# Patient Record
Sex: Female | Born: 1950
Health system: Southern US, Community
[De-identification: ages and names within clinical notes are randomized; demographics above are authoritative.]

## PROBLEM LIST (undated history)

## (undated) DIAGNOSIS — E785 Hyperlipidemia, unspecified: Secondary | ICD-10-CM

## (undated) DIAGNOSIS — T7840XA Allergy, unspecified, initial encounter: Secondary | ICD-10-CM

## (undated) DIAGNOSIS — M8580 Other specified disorders of bone density and structure, unspecified site: Secondary | ICD-10-CM

## (undated) DIAGNOSIS — J309 Allergic rhinitis, unspecified: Secondary | ICD-10-CM

## (undated) HISTORY — DX: Allergy, unspecified, initial encounter: T78.40XA

## (undated) HISTORY — PX: ABDOMINAL HYSTERECTOMY: SHX81

## (undated) HISTORY — PX: BREAST SURGERY: SHX581

---

## 1998-05-04 HISTORY — PX: BREAST BIOPSY: SHX20

## 2005-11-19 ENCOUNTER — Ambulatory Visit: Payer: Self-pay

## 2006-12-02 ENCOUNTER — Ambulatory Visit: Payer: Self-pay

## 2007-12-08 ENCOUNTER — Ambulatory Visit: Payer: Self-pay | Admitting: Family Medicine

## 2008-11-16 ENCOUNTER — Ambulatory Visit: Payer: Self-pay | Admitting: Gastroenterology

## 2008-12-11 ENCOUNTER — Ambulatory Visit: Payer: Self-pay | Admitting: Family Medicine

## 2009-12-11 ENCOUNTER — Ambulatory Visit: Payer: Self-pay | Admitting: Family Medicine

## 2010-12-24 ENCOUNTER — Ambulatory Visit: Payer: Self-pay | Admitting: Family Medicine

## 2011-12-29 ENCOUNTER — Ambulatory Visit: Payer: Self-pay | Admitting: Family Medicine

## 2012-12-29 ENCOUNTER — Ambulatory Visit: Payer: Self-pay | Admitting: Family Medicine

## 2014-03-26 ENCOUNTER — Ambulatory Visit: Payer: Self-pay | Admitting: Family Medicine

## 2014-04-11 ENCOUNTER — Ambulatory Visit: Payer: Self-pay | Admitting: Family Medicine

## 2015-07-15 ENCOUNTER — Other Ambulatory Visit: Payer: Self-pay | Admitting: Family Medicine

## 2015-07-15 DIAGNOSIS — Z1231 Encounter for screening mammogram for malignant neoplasm of breast: Secondary | ICD-10-CM

## 2015-07-22 ENCOUNTER — Ambulatory Visit
Admission: RE | Admit: 2015-07-22 | Discharge: 2015-07-22 | Disposition: A | Discharge status: Home / Self Care | Payer: BLUE CROSS/BLUE SHIELD | Source: Ambulatory Visit | Attending: Family Medicine | Admitting: Family Medicine

## 2015-07-22 DIAGNOSIS — Z1231 Encounter for screening mammogram for malignant neoplasm of breast: Secondary | ICD-10-CM | POA: Insufficient documentation

## 2015-07-22 IMAGING — MG MM ADDITIONAL VIEWS AT NO CHARGE
6 series · 6 of 6 positions shown · non-contrast
Comparison: Prior exams

CLINICAL DATA: Screening callback for 3 questioned abnormalities, 2
in the right breast and 1 in the left breast

EXAM:
DIGITAL DIAGNOSTIC  bilateral MAMMOGRAM WITH CAD
ULTRASOUND right BREAST

[R CC (1 of 2)]
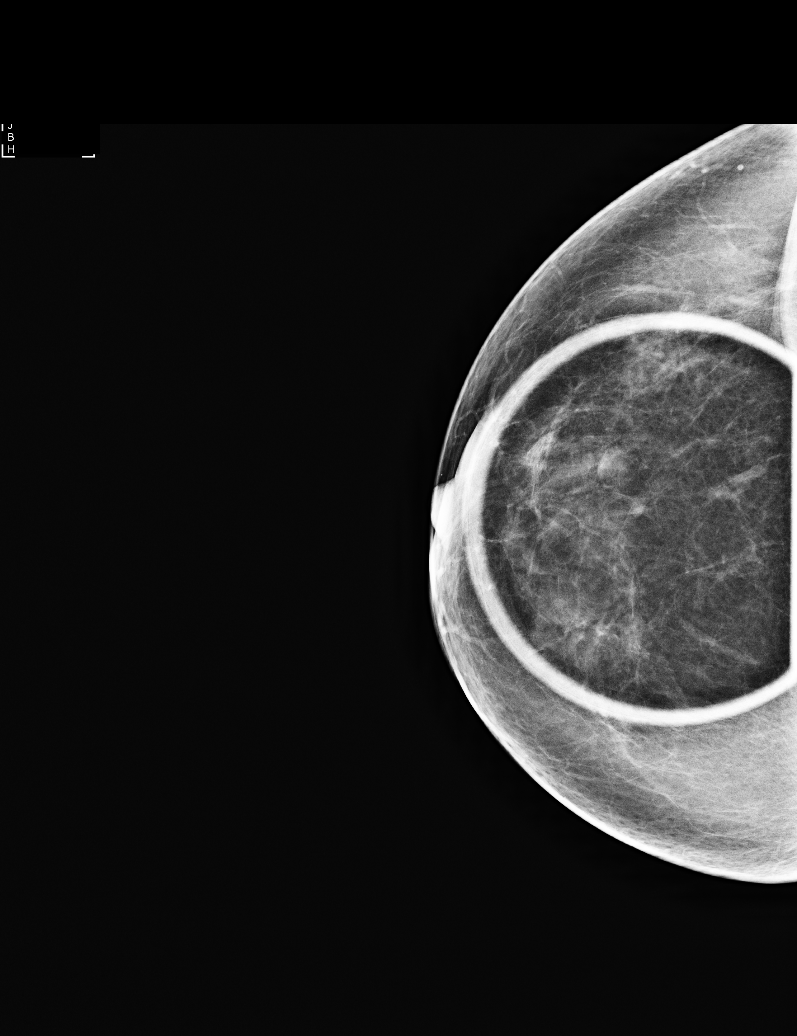

[R CC (2 of 2)]
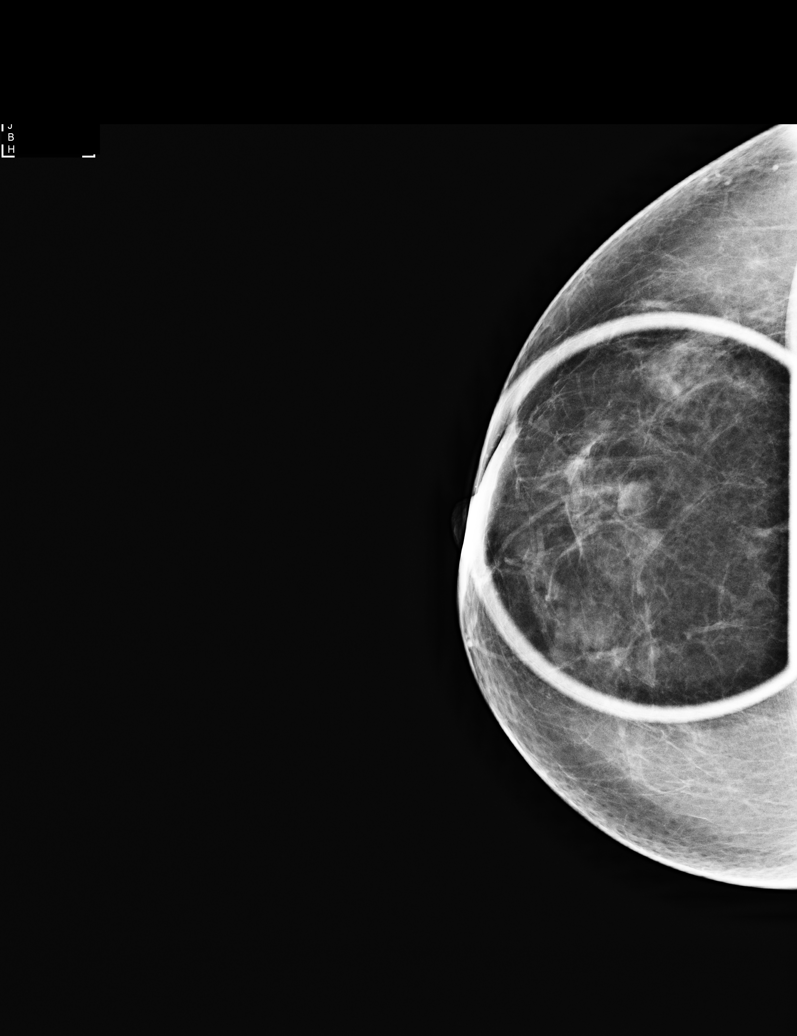

[R MLO (1 of 2)]
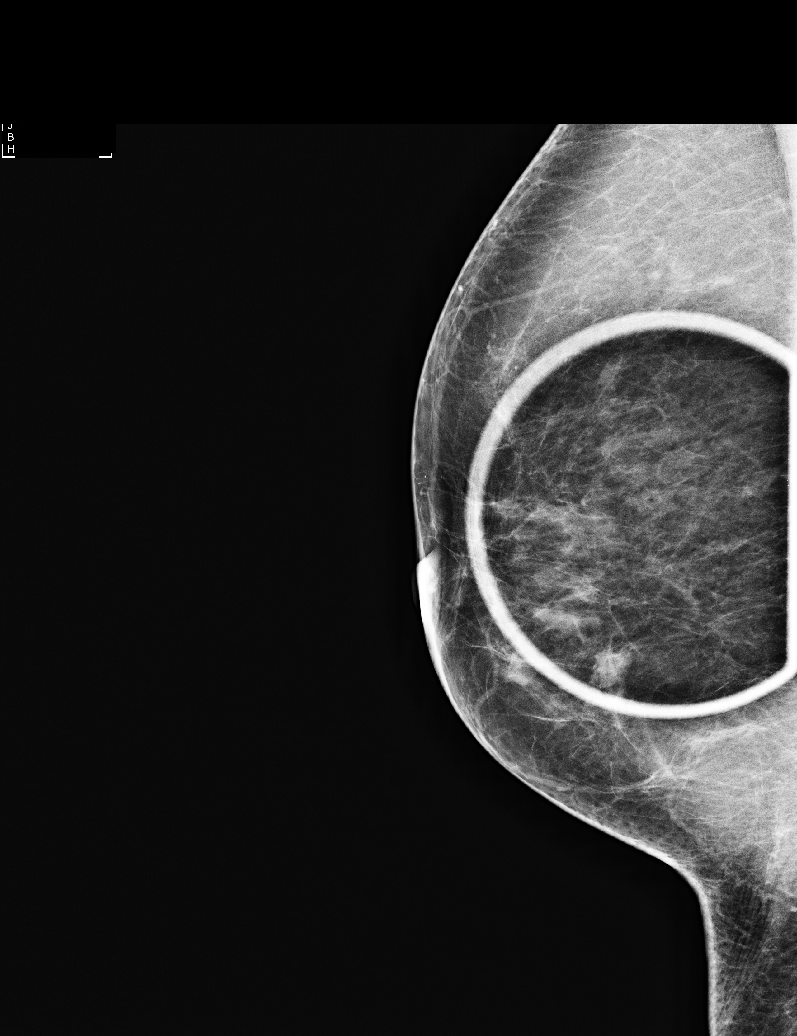

[L MLO]
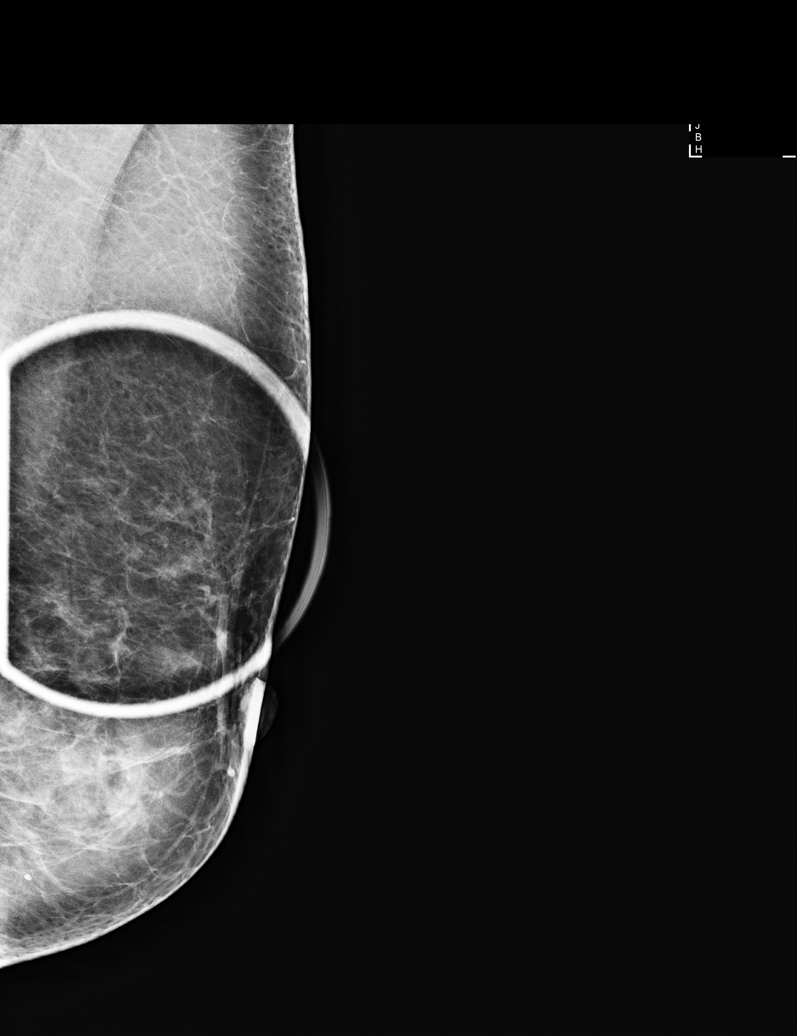

[R MLO (2 of 2)]
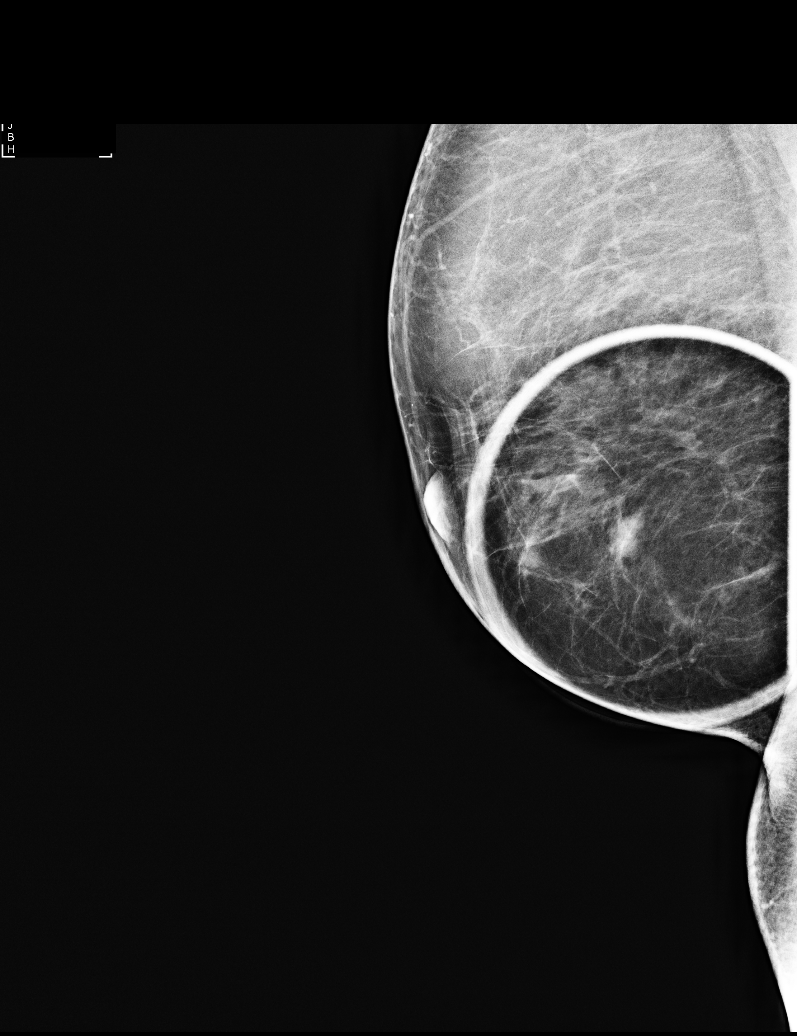

[L ML]
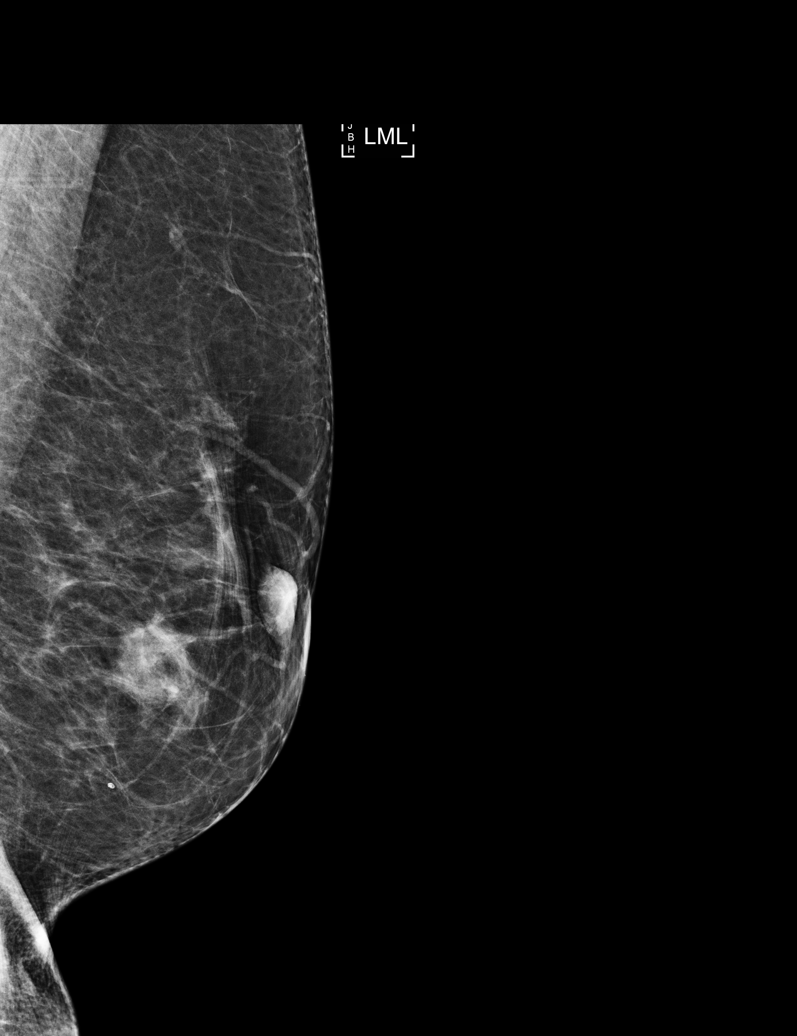

[6 of 6 positions shown; findings below may reference images not displayed]

ACR Breast Density Category b: There are scattered areas of
fibroglandular density.
FINDINGS: No persistent abnormality is identified in the left upper breast or
right central posterior breast. There is a possible persistent mass
in the right breast 6 o'clock location.

Mammographic images were processed with CAD.

On physical exam, I palpate no abnormality in the right breast 6
o'clock location.

Ultrasound is performed, showing a cluster of anechoic simple
appearing cyst right breast 6 o'clock location 3 cm from the nipple
measuring 6 x 6 x 5 mm. This corresponds to the mammographic finding
in this area.
IMPRESSION: No evidence for malignancy in either breast.

RECOMMENDATION:
Screening mammogram in one year.(Code:FG-H-2IR)

I have discussed the findings and recommendations with the patient.
Results were also provided in writing at the conclusion of the
visit. If applicable, a reminder letter will be sent to the patient
regarding the next appointment.

BI-RADS CATEGORY  2: Benign.

## 2015-07-22 IMAGING — US US BREAST*R* LIMITED INC AXILLA
1 series · 4 of 4 positions shown · non-contrast
Comparison: Prior exams

CLINICAL DATA: Screening callback for 3 questioned abnormalities, 2
in the right breast and 1 in the left breast

EXAM:
DIGITAL DIAGNOSTIC  bilateral MAMMOGRAM WITH CAD
ULTRASOUND right BREAST

[Series 1: us breast*right* limited inc axilla · 0.08mm/px · 4 of 4 slices shown]
[im 1/4]
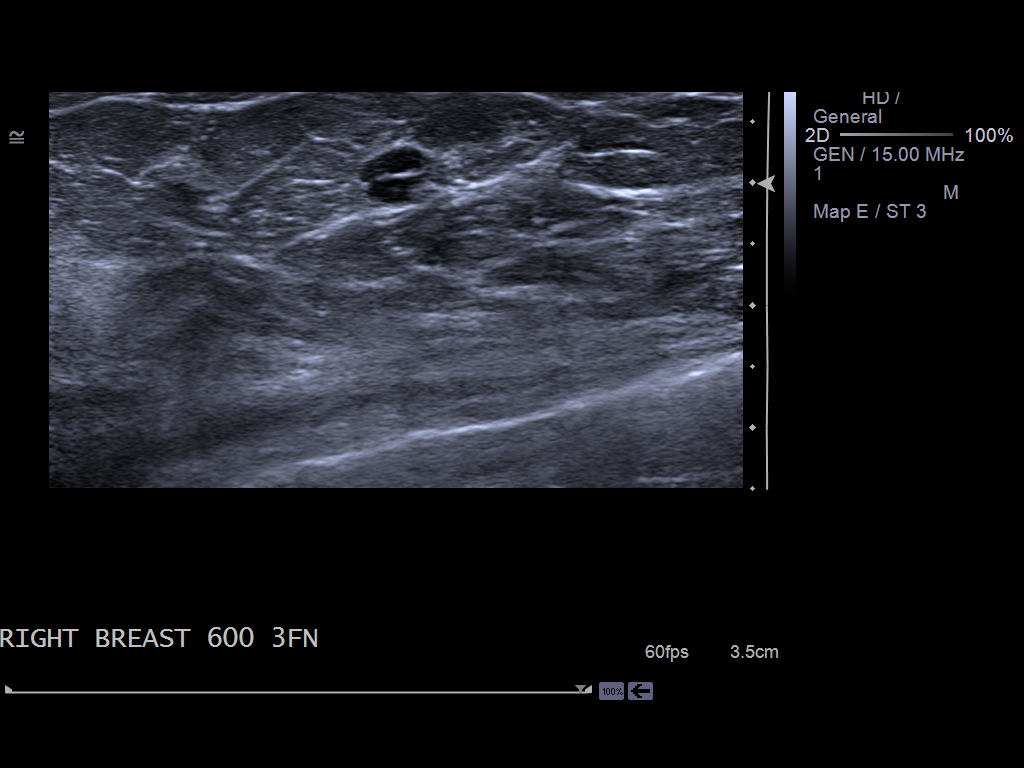
[im 2/4]
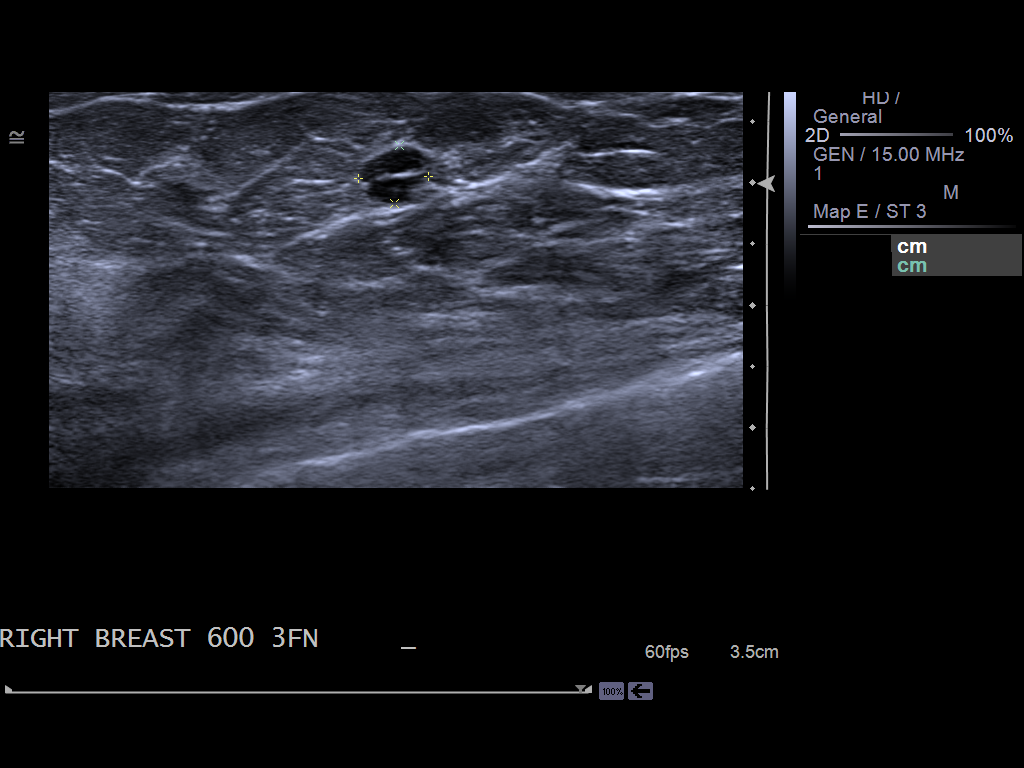
[im 3/4]
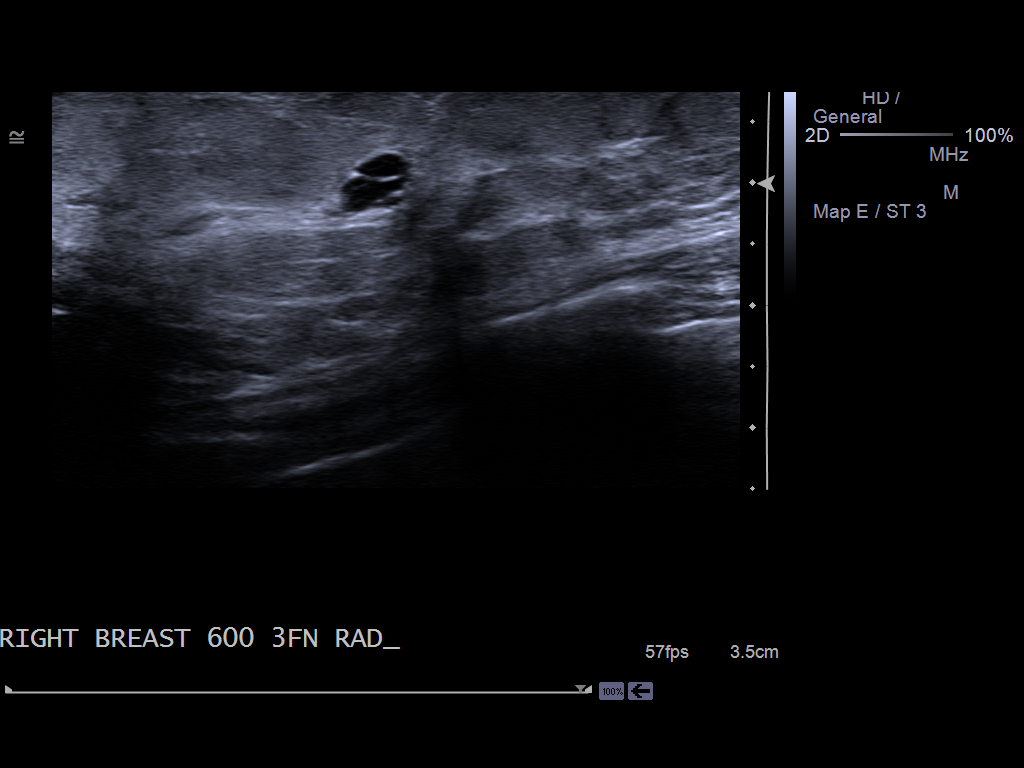
[im 4/4]
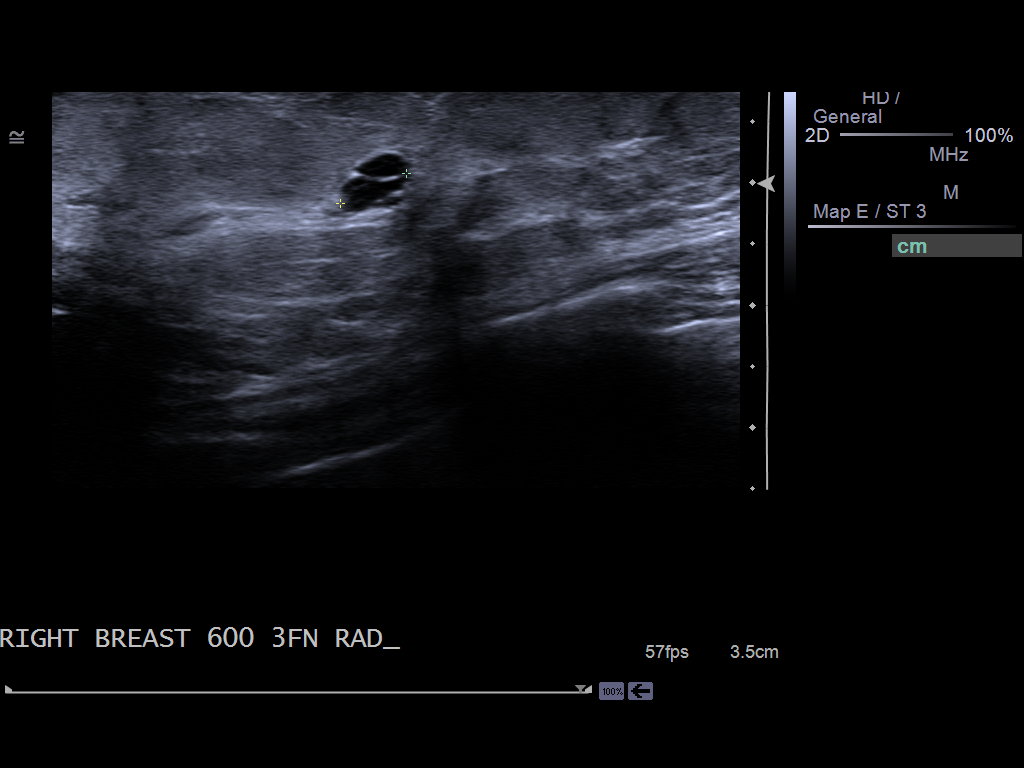

[4 of 4 positions shown; findings below may reference images not displayed]

ACR Breast Density Category b: There are scattered areas of
fibroglandular density.
FINDINGS: No persistent abnormality is identified in the left upper breast or
right central posterior breast. There is a possible persistent mass
in the right breast 6 o'clock location.

Mammographic images were processed with CAD.

On physical exam, I palpate no abnormality in the right breast 6
o'clock location.

Ultrasound is performed, showing a cluster of anechoic simple
appearing cyst right breast 6 o'clock location 3 cm from the nipple
measuring 6 x 6 x 5 mm. This corresponds to the mammographic finding
in this area.
IMPRESSION: No evidence for malignancy in either breast.

RECOMMENDATION:
Screening mammogram in one year.(Code:FG-H-2IR)

I have discussed the findings and recommendations with the patient.
Results were also provided in writing at the conclusion of the
visit. If applicable, a reminder letter will be sent to the patient
regarding the next appointment.

BI-RADS CATEGORY  2: Benign.

## 2016-06-15 ENCOUNTER — Other Ambulatory Visit: Payer: Self-pay | Admitting: Family Medicine

## 2016-06-15 DIAGNOSIS — Z1231 Encounter for screening mammogram for malignant neoplasm of breast: Secondary | ICD-10-CM

## 2016-07-22 ENCOUNTER — Ambulatory Visit
Admission: RE | Admit: 2016-07-22 | Discharge: 2016-07-22 | Disposition: A | Discharge status: Home / Self Care | Payer: BLUE CROSS/BLUE SHIELD | Source: Ambulatory Visit | Attending: Family Medicine | Admitting: Family Medicine

## 2016-07-22 DIAGNOSIS — Z1231 Encounter for screening mammogram for malignant neoplasm of breast: Secondary | ICD-10-CM | POA: Diagnosis not present

## 2017-03-05 DIAGNOSIS — M8580 Other specified disorders of bone density and structure, unspecified site: Secondary | ICD-10-CM | POA: Diagnosis not present

## 2017-03-05 DIAGNOSIS — Z0000 Encounter for general adult medical examination without abnormal findings: Secondary | ICD-10-CM | POA: Diagnosis not present

## 2017-03-29 DIAGNOSIS — J029 Acute pharyngitis, unspecified: Secondary | ICD-10-CM | POA: Diagnosis not present

## 2017-03-29 DIAGNOSIS — J209 Acute bronchitis, unspecified: Secondary | ICD-10-CM | POA: Diagnosis not present

## 2017-07-06 ENCOUNTER — Other Ambulatory Visit: Payer: Self-pay | Admitting: Family Medicine

## 2017-07-06 DIAGNOSIS — Z1231 Encounter for screening mammogram for malignant neoplasm of breast: Secondary | ICD-10-CM

## 2017-07-30 ENCOUNTER — Ambulatory Visit
Admission: RE | Admit: 2017-07-30 | Discharge: 2017-07-30 | Disposition: A | Discharge status: Home / Self Care | Payer: BLUE CROSS/BLUE SHIELD | Source: Ambulatory Visit | Attending: Family Medicine | Admitting: Family Medicine

## 2017-07-30 DIAGNOSIS — Z1231 Encounter for screening mammogram for malignant neoplasm of breast: Secondary | ICD-10-CM | POA: Insufficient documentation

## 2018-02-09 DIAGNOSIS — R195 Other fecal abnormalities: Secondary | ICD-10-CM | POA: Diagnosis not present

## 2018-02-09 DIAGNOSIS — Z23 Encounter for immunization: Secondary | ICD-10-CM | POA: Diagnosis not present

## 2018-02-09 DIAGNOSIS — R197 Diarrhea, unspecified: Secondary | ICD-10-CM | POA: Diagnosis not present

## 2018-02-22 DIAGNOSIS — R195 Other fecal abnormalities: Secondary | ICD-10-CM | POA: Diagnosis not present

## 2018-03-09 DIAGNOSIS — R195 Other fecal abnormalities: Secondary | ICD-10-CM | POA: Diagnosis not present

## 2018-03-09 DIAGNOSIS — K219 Gastro-esophageal reflux disease without esophagitis: Secondary | ICD-10-CM | POA: Diagnosis not present

## 2018-06-03 ENCOUNTER — Encounter: Payer: Self-pay | Admitting: *Deleted

## 2018-06-06 ENCOUNTER — Encounter: Admission: RE | Payer: Self-pay | Source: Home / Self Care

## 2018-06-06 ENCOUNTER — Ambulatory Visit
Admission: RE | Admit: 2018-06-06 | Payer: BLUE CROSS/BLUE SHIELD | Source: Home / Self Care | Admitting: Gastroenterology

## 2018-06-06 HISTORY — DX: Allergic rhinitis, unspecified: J30.9

## 2018-06-06 HISTORY — DX: Other specified disorders of bone density and structure, unspecified site: M85.80

## 2018-06-06 HISTORY — DX: Hyperlipidemia, unspecified: E78.5

## 2018-06-06 SURGERY — COLONOSCOPY WITH PROPOFOL
Anesthesia: General

## 2019-01-12 DIAGNOSIS — H40013 Open angle with borderline findings, low risk, bilateral: Secondary | ICD-10-CM | POA: Diagnosis not present

## 2019-01-12 DIAGNOSIS — H01111 Allergic dermatitis of right upper eyelid: Secondary | ICD-10-CM | POA: Diagnosis not present

## 2019-01-12 DIAGNOSIS — H40033 Anatomical narrow angle, bilateral: Secondary | ICD-10-CM | POA: Diagnosis not present

## 2019-01-20 ENCOUNTER — Ambulatory Visit (INDEPENDENT_AMBULATORY_CARE_PROVIDER_SITE_OTHER): Payer: BC Managed Care – PPO | Admitting: Physician Assistant

## 2019-01-20 ENCOUNTER — Encounter: Payer: Self-pay | Admitting: Physician Assistant

## 2019-01-20 ENCOUNTER — Other Ambulatory Visit: Payer: Self-pay

## 2019-01-20 VITALS — BP 126/80 | HR 57 | Temp 97.1°F | Resp 16 | Ht 64.0 in | Wt 120.4 lb

## 2019-01-20 DIAGNOSIS — Z1329 Encounter for screening for other suspected endocrine disorder: Secondary | ICD-10-CM

## 2019-01-20 DIAGNOSIS — Z0000 Encounter for general adult medical examination without abnormal findings: Secondary | ICD-10-CM | POA: Diagnosis not present

## 2019-01-20 DIAGNOSIS — M810 Age-related osteoporosis without current pathological fracture: Secondary | ICD-10-CM

## 2019-01-20 DIAGNOSIS — Z130 Encounter for screening for diseases of the blood and blood-forming organs and certain disorders involving the immune mechanism: Secondary | ICD-10-CM | POA: Diagnosis not present

## 2019-01-20 DIAGNOSIS — Z131 Encounter for screening for diabetes mellitus: Secondary | ICD-10-CM

## 2019-01-20 DIAGNOSIS — Z1211 Encounter for screening for malignant neoplasm of colon: Secondary | ICD-10-CM

## 2019-01-20 DIAGNOSIS — Z1159 Encounter for screening for other viral diseases: Secondary | ICD-10-CM

## 2019-01-20 DIAGNOSIS — Z114 Encounter for screening for human immunodeficiency virus [HIV]: Secondary | ICD-10-CM

## 2019-01-20 DIAGNOSIS — Z1239 Encounter for other screening for malignant neoplasm of breast: Secondary | ICD-10-CM | POA: Diagnosis not present

## 2019-01-20 DIAGNOSIS — Z13220 Encounter for screening for lipoid disorders: Secondary | ICD-10-CM | POA: Diagnosis not present

## 2019-01-20 NOTE — Progress Notes (Signed)
Patient: Krista Thompson, Female    DOB: 1950-09-06, 68 y.o.   MRN: [id number] Visit Date: 01/20/2019  Today's Provider: Trinna Post, PA-C   Chief Complaint  Patient presents with  . New Patient (Initial Visit)   Subjective:     Annual physical exam Krista Thompson is a 68 y.o. female who presents today to establish care.   Living in [place] with husband of 61 years, has one child each. Three grandchildren. Visits family once a year. Works as a Scientist, water quality at [employer].   Flu shot 01/02/2019.  Pneumonia shot:Due Tetanus shot: due Influenza: given 01/02/2019  Colonoscopy missed due to being in [place] and not being able to come home due to [place].  -----------------------------------------------------------------   Review of Systems  Constitutional: Negative.   HENT: Negative.   Eyes: Negative.   Respiratory: Negative.   Cardiovascular: Negative.   Endocrine: Negative.   Genitourinary: Negative.   Musculoskeletal: Negative.   Skin: Negative.   Allergic/Immunologic: Negative.   Neurological: Negative.   Hematological: Negative.   Psychiatric/Behavioral: Negative.     Social History      She  reports that she has never smoked. She has never used smokeless tobacco. She reports previous alcohol use. She reports previous drug use.       Social History   Socioeconomic History  . Marital status: Married    Spouse name: Not on file  . Number of children: 1  . Years of education: Not on file  . Highest education level: Not on file  Occupational History  . Not on file  Social Needs  . Financial resource strain: Not on file  . Food insecurity    Worry: Not on file    Inability: Not on file  . Transportation needs    Medical: Not on file    Non-medical: Not on file  Tobacco Use  . Smoking status: Never Smoker  . Smokeless tobacco: Never Used  Substance and Sexual Activity  . Alcohol use: Not Currently  . Drug use: Not Currently  . Sexual activity: Not on file    Lifestyle  . Physical activity    Days per week: Not on file    Minutes per session: Not on file  . Stress: Not on file  Relationships  . Social Herbalist on phone: Not on file    Gets together: Not on file    Attends religious service: Not on file    Active member of club or organization: Not on file    Attends meetings of clubs or organizations: Not on file    Relationship status: Not on file  Other Topics Concern  . Not on file  Social History Narrative  . Not on file    Past Medical History:  Diagnosis Date  . Allergic rhinitis   . Hyperlipidemia   . Osteopenia      There are no active problems to display for this patient.   Past Surgical History:  Procedure Laterality Date  . BREAST BIOPSY Bilateral 2000   neg  . CESAREAN SECTION      Family History        Family Status  Relation Name Status  . Mother  Deceased  . Father  Deceased  . Daughter  Alive        Her family history includes Breast cancer (age of onset: 40) in her mother; Heart attack in her father.      No  Known Allergies   Current Outpatient Medications:  .  calcium-vitamin D (OSCAL WITH D) 500-200 MG-UNIT tablet, Take 1 tablet by mouth., Disp: , Rfl:  .  cyanocobalamin 1000 MCG tablet, Take 1,000 mcg by mouth daily., Disp: , Rfl:  .  Flaxseed Oil OIL, by Does not apply route., Disp: , Rfl:  .  Multiple Vitamin (MULTIVITAMIN) tablet, Take 1 tablet by mouth daily., Disp: , Rfl:  .  Omega 3 340 MG CPDR, Take 1 each by mouth 2 (two) times daily., Disp: , Rfl:  .  vitamin C (ASCORBIC ACID) 500 MG tablet, Take 500 mg by mouth daily., Disp: , Rfl:    Patient Care Team: Paulene Floor as PCP - General (Physician Assistant)    Objective:    Vitals: BP 126/80 (BP Location: Left Arm, Patient Position: Sitting, Cuff Size: Normal)   Pulse (!) 57   Temp (!) 97.1 F (36.2 C) (Temporal)   Resp 16   Ht 5\' 4"  (1.626 m)   Wt 120 lb 6.4 oz (54.6 kg)   SpO2 100%   BMI 20.67  kg/m    Vitals:   01/20/19 1515  BP: 126/80  Pulse: (!) 57  Resp: 16  Temp: (!) 97.1 F (36.2 C)  TempSrc: Temporal  SpO2: 100%  Weight: 120 lb 6.4 oz (54.6 kg)  Height: 5\' 4"  (1.626 m)     Physical Exam Constitutional:      Appearance: Normal appearance.  Cardiovascular:     Rate and Rhythm: Normal rate and regular rhythm.     Heart sounds: Normal heart sounds.  Pulmonary:     Effort: Pulmonary effort is normal.     Breath sounds: Normal breath sounds.  Abdominal:     General: Bowel sounds are normal.     Palpations: Abdomen is soft.  Skin:    General: Skin is warm and dry.  Neurological:     Mental Status: She is alert and oriented to person, place, and time. Mental status is at baseline.  Psychiatric:        Mood and Affect: Mood normal.        Behavior: Behavior normal.      Depression Screen PHQ 2/9 Scores 01/20/2019  PHQ - 2 Score 0  PHQ- 9 Score 0       Assessment & Plan:     Routine Health Maintenance and Physical Exam  Exercise Activities and Dietary recommendations Goals   None      There is no immunization history on file for this patient.  Health Maintenance  Topic Date Due  . Hepatitis C Screening  November 07, 1950  . TETANUS/TDAP  07/22/1971  . COLONOSCOPY  07/22/2002  . MAMMOGRAM  07/21/2017  . DEXA SCAN  07/21/2017  . PNA vac Low Risk Adult (1 of 2 - PCV13) 07/21/2017  . INFLUENZA VACCINE  12/03/2018     Discussed health benefits of physical activity, and encouraged her to engage in regular exercise appropriate for her age and condition.    1. Annual physical exam  Due to influenza vaccine < 1 month ago, will have to defer pneumonia and tetanus.   2. Colon cancer screening  - Ambulatory referral to Gastroenterology  3. Breast cancer screening  - MM Digital Screening; Future  4. Osteoporosis, unspecified osteoporosis type, unspecified pathological fracture presence  - DG Bone Density; Future  5. Encounter for hepatitis  C screening test for low risk patient  - Hepatitis C antibody  6. Encounter for screening  for HIV  - HIV antibody (with reflex)  7. Diabetes mellitus screening  - Comprehensive Metabolic Panel (CMET)  8. Screening cholesterol level  - Lipid Profile  9. Screening for deficiency anemia  - CBC with Differential  10. Thyroid disorder screening  - TSH  The entirety of the information documented in the History of Present Illness, Review of Systems and Physical Exam were personally obtained by me. Portions of this information were initially documented by Lynford Humphrey, CMA and reviewed by me for thoroughness and accuracy.   --------------------------------------------------------------------    Trinna Post, PA-C  [facility]

## 2019-01-20 NOTE — Patient Instructions (Addendum)
Call insurance about shingles vaccine  We can schedule

## 2019-01-21 LAB — CBC WITH DIFFERENTIAL/PLATELET
Basophils Absolute: 0 10*3/uL (ref 0.0–0.2)
Basos: 0 %
EOS (ABSOLUTE): 0.1 10*3/uL (ref 0.0–0.4)
Eos: 1 %
Hematocrit: 42.7 % (ref 34.0–46.6)
Hemoglobin: 14.2 g/dL (ref 11.1–15.9)
Immature Grans (Abs): 0 10*3/uL (ref 0.0–0.1)
Immature Granulocytes: 0 %
Lymphocytes Absolute: 1.9 10*3/uL (ref 0.7–3.1)
Lymphs: 39 %
MCH: 29.4 pg (ref 26.6–33.0)
MCHC: 33.3 g/dL (ref 31.5–35.7)
MCV: 88 fL (ref 79–97)
Monocytes Absolute: 0.4 10*3/uL (ref 0.1–0.9)
Monocytes: 7 %
Neutrophils Absolute: 2.6 10*3/uL (ref 1.4–7.0)
Neutrophils: 53 %
Platelets: 175 10*3/uL (ref 150–450)
RBC: 4.83 x10E6/uL (ref 3.77–5.28)
RDW: 13 % (ref 11.7–15.4)
WBC: 4.9 10*3/uL (ref 3.4–10.8)

## 2019-01-21 LAB — LIPID PANEL
Chol/HDL Ratio: 2.8 ratio (ref 0.0–4.4)
Cholesterol, Total: 231 mg/dL — ABNORMAL HIGH (ref 100–199)
HDL: 83 mg/dL (ref >39)
LDL Chol Calc (NIH): 138 mg/dL — ABNORMAL HIGH (ref 0–99)
Triglycerides: 60 mg/dL (ref 0–149)
VLDL Cholesterol Cal: 10 mg/dL (ref 5–40)

## 2019-01-21 LAB — HEPATITIS C ANTIBODY: Hep C Virus Ab: <0.1 s/co ratio (ref 0.0–0.9)

## 2019-01-21 LAB — COMPREHENSIVE METABOLIC PANEL
ALT: 20 IU/L (ref 0–32)
AST: 25 IU/L (ref 0–40)
Albumin/Globulin Ratio: 2 (ref 1.2–2.2)
Albumin: 4.6 g/dL (ref 3.8–4.8)
Alkaline Phosphatase: 68 IU/L (ref 39–117)
BUN/Creatinine Ratio: 28 (ref 12–28)
BUN: 16 mg/dL (ref 8–27)
Bilirubin Total: 0.5 mg/dL (ref 0.0–1.2)
CO2: 23 mmol/L (ref 20–29)
Calcium: 9.8 mg/dL (ref 8.7–10.3)
Chloride: 102 mmol/L (ref 96–106)
Creatinine, Ser: 0.58 mg/dL (ref 0.57–1.00)
GFR calc Af Amer: 111 mL/min/{1.73_m2} (ref >59)
GFR calc non Af Amer: 96 mL/min/{1.73_m2} (ref >59)
Globulin, Total: 2.3 g/dL (ref 1.5–4.5)
Glucose: 79 mg/dL (ref 65–99)
Potassium: 3.5 mmol/L (ref 3.5–5.2)
Sodium: 140 mmol/L (ref 134–144)
Total Protein: 6.9 g/dL (ref 6.0–8.5)

## 2019-01-21 LAB — HIV ANTIBODY (ROUTINE TESTING W REFLEX): HIV Screen 4th Generation wRfx: NONREACTIVE

## 2019-01-21 LAB — TSH: TSH: 2.58 u[IU]/mL (ref 0.450–4.500)

## 2019-01-24 ENCOUNTER — Telehealth: Payer: Self-pay

## 2019-01-24 NOTE — Telephone Encounter (Signed)
-----   Message from Trinna Post, [place] sent at 01/24/2019  9:41 AM EDT ----- Cholesterol just a touch high, likely due to a high good cholesterol or HDL. Her overall risk of heart disease is low and she doesn't need medication to treat this. Other labwork normal.

## 2019-01-24 NOTE — Telephone Encounter (Signed)
LMTCB 01/24/2019  Thanks,   -Mickel Baas

## 2019-01-26 ENCOUNTER — Other Ambulatory Visit: Payer: Self-pay

## 2019-01-26 ENCOUNTER — Telehealth: Payer: Self-pay

## 2019-01-26 DIAGNOSIS — Z1211 Encounter for screening for malignant neoplasm of colon: Secondary | ICD-10-CM

## 2019-01-26 NOTE — Telephone Encounter (Signed)
LMTCB 01/26/2019  Thanks,   -Mickel Baas

## 2019-01-26 NOTE — Telephone Encounter (Signed)
Gastroenterology Pre-Procedure Review  Request Date: 02/15/19 Requesting Physician: Dr. Vicente Males  PATIENT REVIEW QUESTIONS: The patient responded to the following health history questions as indicated:    1. Are you having any GI issues? no 2. Do you have a personal history of Polyps? no 3. Do you have a family history of Colon Cancer or Polyps? no 4. Diabetes Mellitus? no 5. Joint replacements in the past 12 months?no 6. Major health problems in the past 3 months?no 7. Any artificial heart valves, MVP, or defibrillator?no    MEDICATIONS & ALLERGIES:    Patient reports the following regarding taking any anticoagulation/antiplatelet therapy:   Plavix, Coumadin, Eliquis, Xarelto, Lovenox, Pradaxa, Brilinta, or Effient? no Aspirin? no  Patient confirms/reports the following medications:  Current Outpatient Medications  Medication Sig Dispense Refill  . calcium-vitamin D (OSCAL WITH D) 500-200 MG-UNIT tablet Take 1 tablet by mouth.    . cyanocobalamin 1000 MCG tablet Take 1,000 mcg by mouth daily.    . Flaxseed Oil OIL by Does not apply route.    . Multiple Vitamin (MULTIVITAMIN) tablet Take 1 tablet by mouth daily.    . Omega 3 340 MG CPDR Take 1 each by mouth 2 (two) times daily.    . vitamin C (ASCORBIC ACID) 500 MG tablet Take 500 mg by mouth daily.     No current facility-administered medications for this visit.     Patient confirms/reports the following allergies:  No Known Allergies  No orders of the defined types were placed in this encounter.   AUTHORIZATION INFORMATION Primary Insurance: 1D#: Group #:  Secondary Insurance: 1D#: Group #:  SCHEDULE INFORMATION: Date: 02/15/19 Time: Location:[facility]

## 2019-01-31 NOTE — Telephone Encounter (Signed)
Pt advised.   Thanks,   -Kaidon Kinker  

## 2019-02-10 ENCOUNTER — Other Ambulatory Visit: Payer: Self-pay

## 2019-02-10 ENCOUNTER — Other Ambulatory Visit
Admission: RE | Admit: 2019-02-10 | Discharge: 2019-02-10 | Disposition: A | Discharge status: Home / Self Care | Payer: BC Managed Care – PPO | Source: Ambulatory Visit | Attending: Gastroenterology | Admitting: Gastroenterology

## 2019-02-10 DIAGNOSIS — Z20828 Contact with and (suspected) exposure to other viral communicable diseases: Secondary | ICD-10-CM | POA: Diagnosis not present

## 2019-02-10 DIAGNOSIS — Z01812 Encounter for preprocedural laboratory examination: Secondary | ICD-10-CM | POA: Insufficient documentation

## 2019-02-10 LAB — SARS CORONAVIRUS 2 (TAT 6-24 HRS): SARS Coronavirus 2: NEGATIVE

## 2019-02-13 ENCOUNTER — Other Ambulatory Visit: Payer: Self-pay

## 2019-02-13 ENCOUNTER — Telehealth: Payer: Self-pay | Admitting: Gastroenterology

## 2019-02-13 MED ORDER — PEG 3350-KCL-NA BICARB-NACL 420 G PO SOLR: 4000.0000 mL | Freq: Once | ORAL | 0 refills | Status: AC

## 2019-02-13 NOTE — Telephone Encounter (Signed)
Patient is at the pharmacy and needs a prescription sent to [place] for [employer] the su prep is to much money.

## 2019-02-13 NOTE — Telephone Encounter (Signed)
[facility] prescription has been sent to pharmacy.

## 2019-02-15 ENCOUNTER — Encounter: Payer: Self-pay | Admitting: *Deleted

## 2019-02-15 ENCOUNTER — Ambulatory Visit
Admission: RE | Admit: 2019-02-15 | Discharge: 2019-02-15 | Disposition: A | Discharge status: Home / Self Care | Payer: BC Managed Care – PPO | Attending: Gastroenterology | Admitting: Gastroenterology

## 2019-02-15 ENCOUNTER — Encounter
Admission: RE | Disposition: A | Discharge status: Home / Self Care | Payer: Self-pay | Source: Home / Self Care | Attending: Gastroenterology

## 2019-02-15 ENCOUNTER — Ambulatory Visit: Payer: BC Managed Care – PPO | Admitting: Certified Registered Nurse Anesthetist

## 2019-02-15 ENCOUNTER — Other Ambulatory Visit: Payer: Self-pay

## 2019-02-15 DIAGNOSIS — Z1211 Encounter for screening for malignant neoplasm of colon: Secondary | ICD-10-CM | POA: Diagnosis not present

## 2019-02-15 DIAGNOSIS — K219 Gastro-esophageal reflux disease without esophagitis: Secondary | ICD-10-CM | POA: Insufficient documentation

## 2019-02-15 HISTORY — PX: COLONOSCOPY WITH PROPOFOL: SHX5780

## 2019-02-15 SURGERY — COLONOSCOPY WITH PROPOFOL
Anesthesia: General

## 2019-02-15 MED ORDER — SODIUM CHLORIDE 0.9 % IV SOLN
INTRAVENOUS | Status: DC
  Administered 2019-02-15: 13:00:00 via INTRAVENOUS

## 2019-02-15 MED ORDER — PROPOFOL 500 MG/50ML IV EMUL
INTRAVENOUS | Status: AC
  Filled 2019-02-15: qty 50

## 2019-02-15 MED ORDER — PROPOFOL 500 MG/50ML IV EMUL
INTRAVENOUS | Status: DC | PRN
  Administered 2019-02-15: 125 ug/kg/min via INTRAVENOUS

## 2019-02-15 MED ORDER — LIDOCAINE HCL (CARDIAC) PF 100 MG/5ML IV SOSY
PREFILLED_SYRINGE | INTRAVENOUS | Status: DC | PRN
  Administered 2019-02-15: 50 mg via INTRAVENOUS

## 2019-02-15 MED ORDER — PROPOFOL 10 MG/ML IV BOLUS
INTRAVENOUS | Status: AC
  Filled 2019-02-15: qty 20

## 2019-02-15 MED ORDER — PROPOFOL 10 MG/ML IV BOLUS
INTRAVENOUS | Status: DC | PRN
  Administered 2019-02-15: 40 mg via INTRAVENOUS

## 2019-02-15 MED ORDER — ONDANSETRON HCL 4 MG/2ML IJ SOLN
INTRAMUSCULAR | Status: DC | PRN
  Administered 2019-02-15: 4 mg via INTRAVENOUS

## 2019-02-15 NOTE — Op Note (Signed)
[facility] Gastroenterology Patient Name: Krista Thompson Procedure Date: 02/15/2019 12:18 PM MRN: [id number] Account #: [id number] Date of Birth: [birth year] Admit Type: Outpatient Age: 68 Room: [facility] ENDO ROOM 1 Gender: Female Note Status: Finalized Procedure:            Colonoscopy Indications:          Screening for colorectal malignant neoplasm Providers:            Jonathon Bellows MD, MD Medicines:            Monitored Anesthesia Care Complications:        No immediate complications. Procedure:            Pre-Anesthesia Assessment:                       - Prior to the procedure, a History and Physical was                        performed, and patient medications, allergies and                        sensitivities were reviewed. The patient's tolerance of                        previous anesthesia was reviewed.                       - The risks and benefits of the procedure and the                        sedation options and risks were discussed with the                        patient. All questions were answered and informed                        consent was obtained.                       - ASA Grade Assessment: II - A patient with mild                        systemic disease.                       After obtaining informed consent, the colonoscope was                        passed under direct vision. Throughout the procedure,                        the patient's blood pressure, pulse, and oxygen                        saturations were monitored continuously. The                        Colonoscope was introduced through the anus and                        advanced to the the cecum, identified by the  appendiceal orifice. The colonoscopy was performed with                        ease. The patient tolerated the procedure well. The                        quality of the bowel preparation was excellent. Findings:      The perianal and digital rectal  examinations were normal.      The entire examined colon appeared normal on direct and retroflexion       views. Impression:           - The entire examined colon is normal on direct and                        retroflexion views.                       - No specimens collected. Recommendation:       - Discharge patient to home (with escort).                       - Resume previous diet.                       - Continue present medications.                       - Repeat colonoscopy in 10 years for screening purposes. Procedure Code(s):    --- Professional ---                       [phone], Colonoscopy, flexible; diagnostic, including                        collection of specimen(s) by brushing or washing, when                        performed (separate procedure) Diagnosis Code(s):    --- Professional ---                       Z12.11, Encounter for screening for malignant neoplasm                        of colon CPT copyright 2019 American Medical Association. All rights reserved. The codes documented in this report are preliminary and upon coder review may  be revised to meet current compliance requirements. Jonathon Bellows, MD Jonathon Bellows MD, MD 02/15/2019 1:17:39 PM This report has been signed electronically. Number of Addenda: 0 Note Initiated On: 02/15/2019 12:18 PM Scope Withdrawal Time: 0 hours 12 minutes 27 seconds  Total Procedure Duration: 0 hours 20 minutes 6 seconds  Estimated Blood Loss: Estimated blood loss: none.      [facility]

## 2019-02-15 NOTE — Anesthesia Preprocedure Evaluation (Signed)
Anesthesia Evaluation  Patient identified by MRN, date of birth, ID band Patient awake    Reviewed: Allergy & Precautions, NPO status , Patient's Chart, lab work & pertinent test results  History of Anesthesia Complications Negative for: history of anesthetic complications  Airway Mallampati: II       Dental   Pulmonary neg sleep apnea, neg COPD, Not current smoker,           Cardiovascular (-) hypertension(-) Past MI and (-) CHF (-) dysrhythmias (-) Valvular Problems/Murmurs     Neuro/Psych neg Seizures    GI/Hepatic Neg liver ROS, GERD  Medicated and Controlled,  Endo/Other  neg diabetes  Renal/GU negative Renal ROS     Musculoskeletal   Abdominal   Peds  Hematology   Anesthesia Other Findings   Reproductive/Obstetrics                             Anesthesia Physical Anesthesia Plan  ASA: II  Anesthesia Plan: General   Post-op Pain Management:    Induction: Intravenous  PONV Risk Score and Plan: 3 and TIVA, Propofol infusion and Ondansetron  Airway Management Planned: Nasal Cannula  Additional Equipment:   Intra-op Plan:   Post-operative Plan:   Informed Consent: I have reviewed the patients History and Physical, chart, labs and discussed the procedure including the risks, benefits and alternatives for the proposed anesthesia with the patient or authorized representative who has indicated his/her understanding and acceptance.       Plan Discussed with:   Anesthesia Plan Comments:         Anesthesia Quick Evaluation

## 2019-02-15 NOTE — H&P (Signed)
Jonathon Bellows, MD [address], [place], [place], [place], [address] [address], [place], [place], [place], [address] Phone: [phone]  Fax: [phone]  Primary Care Physician:  Trinna Post, PA-C   Pre-Procedure History & Physical: HPI:  Krista Thompson is a 68 y.o. female is here for an colonoscopy.   Past Medical History:  Diagnosis Date  . Allergic rhinitis   . Hyperlipidemia   . Osteopenia     Past Surgical History:  Procedure Laterality Date  . ABDOMINAL HYSTERECTOMY    . BREAST BIOPSY Bilateral 2000   neg  . CESAREAN SECTION      Prior to Admission medications   Medication Sig Start Date End Date Taking? Authorizing Provider  calcium-vitamin D (OSCAL WITH D) 500-200 MG-UNIT tablet Take 1 tablet by mouth.   Yes [provider]  cyanocobalamin 1000 MCG tablet Take 1,000 mcg by mouth daily.   Yes [provider]  Flaxseed Oil OIL by Does not apply route.   Yes [provider]  Multiple Vitamin (MULTIVITAMIN) tablet Take 1 tablet by mouth daily.   Yes [provider]  Omega 3 340 MG CPDR Take 1 each by mouth 2 (two) times daily.   Yes [provider]  vitamin C (ASCORBIC ACID) 500 MG tablet Take 500 mg by mouth daily.   Yes [provider]    Allergies as of 01/26/2019  . (No Known Allergies)    Family History  Problem Relation Age of Onset  . Breast cancer Mother 80  . Heart attack Father     Social History   Socioeconomic History  . Marital status: Married    Spouse name: Not on file  . Number of children: 1  . Years of education: Not on file  . Highest education level: Not on file  Occupational History  . Not on file  Social Needs  . Financial resource strain: Not on file  . Food insecurity    Worry: Not on file    Inability: Not on file  . Transportation needs    Medical: Not on file    Non-medical: Not on file  Tobacco Use  . Smoking status: Never Smoker  . Smokeless  tobacco: Never Used  Substance and Sexual Activity  . Alcohol use: Not Currently  . Drug use: Not Currently  . Sexual activity: Not on file  Lifestyle  . Physical activity    Days per week: Not on file    Minutes per session: Not on file  . Stress: Not on file  Relationships  . Social Herbalist on phone: Not on file    Gets together: Not on file    Attends religious service: Not on file    Active member of club or organization: Not on file    Attends meetings of clubs or organizations: Not on file    Relationship status: Not on file  . Intimate partner violence    Fear of current or ex partner: Not on file    Emotionally abused: Not on file    Physically abused: Not on file    Forced sexual activity: Not on file  Other Topics Concern  . Not on file  Social History Narrative  . Not on file    Review of Systems: See HPI, otherwise negative ROS  Physical Exam: BP 138/72   Pulse (!) 52   Temp (!) 96.8 F (36 C) (Tympanic)   Resp 16  Ht 5\' 1"  (1.549 m)   Wt 54.4 kg   SpO2 100%   BMI 22.67 kg/m  General:   Alert,  pleasant and cooperative in NAD Head:  Normocephalic and atraumatic. Neck:  Supple; no masses or thyromegaly. Lungs:  Clear throughout to auscultation, normal respiratory effort.    Heart:  +S1, +S2, Regular rate and rhythm, No edema. Abdomen:  Soft, nontender and nondistended. Normal bowel sounds, without guarding, and without rebound.   Neurologic:  Alert and  oriented x4;  grossly normal neurologically.  Impression/Plan: [name] is here for an colonoscopy to be performed for Screening colonoscopy average risk   Risks, benefits, limitations, and alternatives regarding  colonoscopy have been reviewed with the patient.  Questions have been answered.  All parties agreeable.   Jonathon Bellows, MD  02/15/2019, 12:40 PM

## 2019-02-15 NOTE — Anesthesia Post-op Follow-up Note (Signed)
Anesthesia QCDR form completed.        

## 2019-02-15 NOTE — Transfer of Care (Signed)
Immediate Anesthesia Transfer of Care Note  Patient: Krista Thompson  Procedure(s) Performed: COLONOSCOPY WITH PROPOFOL (N/A )  Patient Location: PACU  Anesthesia Type:General  Level of Consciousness: awake, alert  and oriented  Airway & Oxygen Therapy: Patient Spontanous Breathing and Patient connected to nasal cannula oxygen  Post-op Assessment: Report given to RN and Post -op Vital signs reviewed and stable  Post vital signs: Reviewed and stable  Last Vitals:  Vitals Value Taken Time  BP    Temp    Pulse 54 02/15/19 1321  Resp 19 02/15/19 1321  SpO2 100 % 02/15/19 1321  Vitals shown include unvalidated device data.  Last Pain:  Vitals:   02/15/19 1225  TempSrc: Tympanic         Complications: No apparent anesthesia complications

## 2019-02-15 NOTE — Anesthesia Postprocedure Evaluation (Signed)
Anesthesia Post Note  Patient: Krista Thompson  Procedure(s) Performed: COLONOSCOPY WITH PROPOFOL (N/A )  Patient location during evaluation: Endoscopy Anesthesia Type: General Level of consciousness: awake and alert Pain management: pain level controlled Vital Signs Assessment: post-procedure vital signs reviewed and stable Respiratory status: spontaneous breathing and respiratory function stable Cardiovascular status: stable Anesthetic complications: no     Last Vitals:  Vitals:   02/15/19 1225 02/15/19 1321  BP: 138/72 (!) 99/46  Pulse: (!) 52 (!) 53  Resp: 16 19  Temp: (!) 36 C (!) 36.2 C  SpO2: 100% 100%    Last Pain:  Vitals:   02/15/19 1321  TempSrc: Tympanic  PainSc: Asleep                 KEPHART,WILLIAM K

## 2019-02-16 ENCOUNTER — Encounter: Payer: Self-pay | Admitting: Gastroenterology

## 2019-02-21 ENCOUNTER — Ambulatory Visit: Payer: BC Managed Care – PPO | Admitting: Physician Assistant

## 2019-02-21 ENCOUNTER — Other Ambulatory Visit: Payer: Self-pay

## 2019-02-21 ENCOUNTER — Encounter: Payer: Self-pay | Admitting: Physician Assistant

## 2019-02-21 VITALS — Temp 97.3°F | Wt 117.6 lb

## 2019-02-21 DIAGNOSIS — Z23 Encounter for immunization: Secondary | ICD-10-CM

## 2019-02-21 NOTE — Progress Notes (Signed)
Patient tolerated vaccine well 

## 2019-06-12 ENCOUNTER — Other Ambulatory Visit: Payer: BC Managed Care – PPO

## 2019-10-20 DIAGNOSIS — L089 Local infection of the skin and subcutaneous tissue, unspecified: Secondary | ICD-10-CM | POA: Diagnosis not present

## 2019-10-20 DIAGNOSIS — W57XXXA Bitten or stung by nonvenomous insect and other nonvenomous arthropods, initial encounter: Secondary | ICD-10-CM | POA: Diagnosis not present

## 2019-10-20 DIAGNOSIS — S80862A Insect bite (nonvenomous), left lower leg, initial encounter: Secondary | ICD-10-CM | POA: Diagnosis not present

## 2019-10-30 ENCOUNTER — Ambulatory Visit: Payer: BC Managed Care – PPO | Admitting: Physician Assistant

## 2019-10-30 NOTE — Progress Notes (Deleted)
 {  This patient's chart is due for periodic physician review. Please check 'Cosign Required' and forward to your supervising physician.:1}   Established patient visit   Patient: QUENNA DOEPKE   DOB: 08/01/1950   69 y.o. Female  MRN: [id number] Visit Date: 10/30/2019  Today's healthcare provider: Trinna Post, PA-C   No chief complaint on file.  Subjective    HPI Follow up for ***  The patient was last seen for this {NUMBERS 1-12:18279} {days/wks/mos/yrs:310907} ago. Changes made at last visit include ***.  She reports {excellent/good/fair/poor:19665} compliance with treatment. She feels that condition is {improved/worse/unchanged:3041574}. She {is/is not:21021397} having side effects. ***     {Show patient history (optional):23778::" "}   Medications: Outpatient Medications Prior to Visit  Medication Sig  . calcium-vitamin D (OSCAL WITH D) 500-200 MG-UNIT tablet Take 1 tablet by mouth.  . cyanocobalamin 1000 MCG tablet Take 1,000 mcg by mouth daily.  . Flaxseed Oil OIL by Does not apply route.  . Multiple Vitamin (MULTIVITAMIN) tablet Take 1 tablet by mouth daily.  . Omega 3 340 MG CPDR Take 1 each by mouth 2 (two) times daily.  . vitamin C (ASCORBIC ACID) 500 MG tablet Take 500 mg by mouth daily.   No facility-administered medications prior to visit.    Review of Systems  {Heme  Chem  Endocrine  Serology  Results Review (optional):23779::" "}  Objective    There were no vitals taken for this visit. {Show previous vital signs (optional):23777::" "}  Physical Exam  ***  No results found for any visits on 10/30/19.  Assessment & Plan     ***  No follow-ups on file.      {provider attestation***:1}   Paulene Floor  [facility] [phone] (phone) [phone] (fax)  [place]

## 2021-09-15 NOTE — Progress Notes (Signed)
I,Krista Thompson,acting as a Education administrator for Krista Thompson.,have documented all relevant documentation on the behalf of Krista Thompson,as directed by  Krista Thompson while in the presence of Krista Thompson.    Complete physical exam   Patient: Krista Thompson   DOB: [birth year]   71 y.o. Female  MRN: [id number] Visit Date: 09/16/2021  Today's healthcare provider: [name], Thompson   CC: New patient / CPE  Subjective    Krista Thompson is a 71 y.o. female who presents today for a complete physical exam.  She reports consuming a general diet. Home exercise routine includes walking 10 hrs per week. She generally feels well. She reports sleeping well. She does not have additional problems to discuss today.    Past Medical History:  Diagnosis Date   Allergic rhinitis    Hyperlipidemia    Osteopenia    Past Surgical History:  Procedure Laterality Date   ABDOMINAL HYSTERECTOMY     BREAST BIOPSY Bilateral 2000   neg   CESAREAN SECTION     COLONOSCOPY WITH PROPOFOL N/A 02/15/2019   Procedure: COLONOSCOPY WITH PROPOFOL;  Surgeon: Krista Bellows, MD;  Location: [facility] ENDOSCOPY;  Service: Gastroenterology;  Laterality: N/A;   Social History   Socioeconomic History   Marital status: Married    Spouse name: Not on file   Number of children: 1   Years of education: Not on file   Highest education level: Not on file  Occupational History   Not on file  Tobacco Use   Smoking status: Never   Smokeless tobacco: Never  Vaping Use   Vaping Use: Never used  Substance and Sexual Activity   Alcohol use: Not Currently   Drug use: Not Currently   Sexual activity: Not on file  Other Topics Concern   Not on file  Social History Narrative   Not on file   Social Determinants of Health   Financial Resource Strain: Not on file  Food Insecurity: Not on file  Transportation Needs: Not on file  Physical Activity: Not on file  Stress: Not on file  Social Connections: Not on file   Intimate Partner Violence: Not on file   Family Status  Relation Name Status   Mother  Deceased   Father  Deceased   Daughter  Alive   Family History  Problem Relation Age of Onset   Breast cancer Mother 26   Heart attack Father    No Known Allergies  Patient Care Team: Krista Thompson as PCP - General (Physician Assistant)   Medications: Outpatient Medications Prior to Visit  Medication Sig   calcium-vitamin D (OSCAL WITH D) 500-200 MG-UNIT tablet Take 1 tablet by mouth.   cyanocobalamin 1000 MCG tablet Take 1,000 mcg by mouth daily.   Flaxseed Oil OIL by Does not apply route.   Multiple Vitamin (MULTIVITAMIN) tablet Take 1 tablet by mouth daily.   Omega 3 340 MG CPDR Take 1 each by mouth 2 (two) times daily.   vitamin C (ASCORBIC ACID) 500 MG tablet Take 500 mg by mouth daily.   No facility-administered medications prior to visit.    Review of Systems  Eyes:  Positive for itching.  Allergic/Immunologic: Positive for environmental allergies.  All other systems reviewed and are negative.    Objective     BP 137/65 (BP Location: Right Arm, Patient Position: Sitting, Cuff Size: Normal)   Pulse 68   Temp 98.4 F (36.9 C) (Oral)   Resp  16   Wt 115 lb 8 oz (52.4 kg)   SpO2 100%   BMI 21.82 kg/m     Physical Exam Vitals reviewed.  Constitutional:      General: She is not in acute distress.    Appearance: Normal appearance. She is well-developed. She is not diaphoretic.  HENT:     Head: Normocephalic and atraumatic.     Right Ear: Tympanic membrane, ear canal and external ear normal. There is no impacted cerumen.     Left Ear: Tympanic membrane, ear canal and external ear normal. There is no impacted cerumen.     Ears:     Comments: Skin growth behind her left ear    Nose: Congestion and rhinorrhea present.     Mouth/Throat:     Mouth: Mucous membranes are moist.     Pharynx: Oropharynx is clear. No oropharyngeal exudate.  Eyes:     General: No  scleral icterus.    Extraocular Movements: Extraocular movements intact.     Conjunctiva/sclera: Conjunctivae normal.     Pupils: Pupils are equal, round, and reactive to light.  Neck:     Thyroid: No thyromegaly.  Cardiovascular:     Rate and Rhythm: Normal rate and regular rhythm.     Pulses: Normal pulses.     Heart sounds: Normal heart sounds. No murmur heard. Pulmonary:     Effort: Pulmonary effort is normal. No respiratory distress.     Breath sounds: Normal breath sounds. No wheezing or rales.  Abdominal:     General: Abdomen is flat. Bowel sounds are normal. There is no distension.     Palpations: Abdomen is soft. There is no mass.     Tenderness: There is no abdominal tenderness. There is no guarding or rebound.     Hernia: No hernia is present.  Musculoskeletal:        General: No swelling, tenderness or deformity. Normal range of motion.     Cervical back: Neck supple.     Right lower leg: No edema.     Left lower leg: No edema.  Lymphadenopathy:     Cervical: No cervical adenopathy.  Skin:    General: Skin is warm and dry.     Capillary Refill: Capillary refill takes less than 2 seconds.     Findings: No rash.  Neurological:     General: No focal deficit present.     Mental Status: She is alert and oriented to person, place, and time. Mental status is at baseline.     Sensory: No sensory deficit.     Motor: No weakness.     Coordination: Coordination normal.     Gait: Gait normal.     Deep Tendon Reflexes: Reflexes normal.  Psychiatric:        Mood and Affect: Mood normal.        Behavior: Behavior normal.        Thought Content: Thought content normal.        Judgment: Judgment normal.     Last depression screening scores    01/20/2019    3:07 PM  PHQ 2/9 Scores  PHQ - 2 Score 0  PHQ- 9 Score 0   Last fall risk screening    01/20/2019    3:08 PM  [place] in the past year? 0  Number falls in past yr: 0  Injury with Fall? 0   Last  Audit-C alcohol use screening    01/20/2019  3:07 PM  Alcohol Use Disorder Test (AUDIT)  1. How often do you have a drink containing alcohol? 1  2. How many drinks containing alcohol do you have on a typical day when you are drinking? 0  3. How often do you have six or more drinks on one occasion? 0  AUDIT-C Score 1   A score of 3 or more in women, and 4 or more in men indicates increased risk for alcohol abuse, EXCEPT if all of the points are from question 1   No results found for any visits on 09/16/21.  Assessment & Plan    Routine Health Maintenance and Physical Exam  Exercise Activities and Dietary recommendations  Goals   None       Immunization History  Administered Date(s) Administered   Influenza, High Dose Seasonal PF 01/02/2019   Influenza-Unspecified 02/09/2018   Pneumococcal Conjugate-13 02/21/2019   Tdap 04/03/2008   Zoster Recombinat (Shingrix) 02/21/2019    Health Maintenance  Topic Date Due   COVID-19 Vaccine (1) Never done   DEXA SCAN  Never done   TETANUS/TDAP  04/03/2018   Zoster Vaccines- Shingrix (2 of 2) 04/18/2019   MAMMOGRAM  07/31/2019   Pneumonia Vaccine 11+ Years old (2 - PPSV23 if available, else PCV20) 02/21/2020   INFLUENZA VACCINE  12/02/2021   COLONOSCOPY (Pts 45-85yr Insurance coverage will need to be confirmed)  02/14/2029   Hepatitis C Screening  Completed   HPV VACCINES  Aged Out    Discussed health benefits of physical activity, and encouraged her to engage in regular exercise appropriate for her age and condition.  1. Annual physical exam  - MM 3D SCREEN BREAST BILATERAL; Future - DG Bone Density; Future - Lipid panel - CBC with Differential/Platelet - Comprehensive metabolic panel - TSH   [place]abs [place].  2. Skin growth  Behind the left ear Asymptomatic/could be irritated when she wears her glasses - Ambulatory referral to Dermatology  3. Need for Td vaccine  - Td  vaccine preservative free greater than or equal to 7yo IM  4. Need for pneumococcal 20-valent conjugate vaccination  - Pneumococcal conjugate vaccine 20-valent (Prevnar 20)     FU in 10 days for lab results/physical forms for insurance   The patient was advised to call back or seek an in-person evaluation if the symptoms worsen or if the condition fails to improve as anticipated.  I discussed the assessment and treatment plan with the patient. The patient was provided an opportunity to ask questions and all were answered. The patient agreed with the plan and demonstrated an understanding of the instructions.  The entirety of the information documented in the History of Present Illness, Review of Systems and Physical Exam were personally obtained by me. Portions of this information were initially documented by the CMA and reviewed by me for thoroughness and accuracy.  Portions of this note were created using dictation software and may contain typographical errors.     JMardene Speak Thompson  [phone](phone) [phone](fax)  [place]

## 2021-09-16 ENCOUNTER — Encounter: Payer: Self-pay | Admitting: Physician Assistant

## 2021-09-16 ENCOUNTER — Ambulatory Visit (INDEPENDENT_AMBULATORY_CARE_PROVIDER_SITE_OTHER): Payer: No Typology Code available for payment source | Admitting: Physician Assistant

## 2021-09-16 VITALS — BP 137/65 | HR 68 | Temp 98.4°F | Resp 16 | Wt 115.5 lb

## 2021-09-16 DIAGNOSIS — Z0000 Encounter for general adult medical examination without abnormal findings: Secondary | ICD-10-CM | POA: Diagnosis not present

## 2021-09-16 DIAGNOSIS — Z23 Encounter for immunization: Secondary | ICD-10-CM | POA: Diagnosis not present

## 2021-09-16 DIAGNOSIS — D492 Neoplasm of unspecified behavior of bone, soft tissue, and skin: Secondary | ICD-10-CM | POA: Diagnosis not present

## 2021-09-18 LAB — COMPREHENSIVE METABOLIC PANEL
ALT: 22 IU/L (ref 0–32)
AST: 27 IU/L (ref 0–40)
Albumin/Globulin Ratio: 2 (ref 1.2–2.2)
Albumin: 4.7 g/dL (ref 3.8–4.8)
Alkaline Phosphatase: 75 IU/L (ref 44–121)
BUN/Creatinine Ratio: 24 (ref 12–28)
BUN: 17 mg/dL (ref 8–27)
Bilirubin Total: 0.6 mg/dL (ref 0.0–1.2)
CO2: 21 mmol/L (ref 20–29)
Calcium: 9.2 mg/dL (ref 8.7–10.3)
Chloride: 101 mmol/L (ref 96–106)
Creatinine, Ser: 0.7 mg/dL (ref 0.57–1.00)
Globulin, Total: 2.4 g/dL (ref 1.5–4.5)
Glucose: 91 mg/dL (ref 70–99)
Potassium: 4.1 mmol/L (ref 3.5–5.2)
Sodium: 138 mmol/L (ref 134–144)
Total Protein: 7.1 g/dL (ref 6.0–8.5)
eGFR: 94 mL/min/{1.73_m2} (ref >59)

## 2021-09-18 LAB — CBC WITH DIFFERENTIAL/PLATELET
Basophils Absolute: 0 10*3/uL (ref 0.0–0.2)
Basos: 0 %
EOS (ABSOLUTE): 0 10*3/uL (ref 0.0–0.4)
Eos: 1 %
Hematocrit: 42.3 % (ref 34.0–46.6)
Hemoglobin: 14.3 g/dL (ref 11.1–15.9)
Immature Grans (Abs): 0 10*3/uL (ref 0.0–0.1)
Immature Granulocytes: 0 %
Lymphocytes Absolute: 1.5 10*3/uL (ref 0.7–3.1)
Lymphs: 24 %
MCH: 29.5 pg (ref 26.6–33.0)
MCHC: 33.8 g/dL (ref 31.5–35.7)
MCV: 87 fL (ref 79–97)
Monocytes Absolute: 0.4 10*3/uL (ref 0.1–0.9)
Monocytes: 7 %
Neutrophils Absolute: 4.2 10*3/uL (ref 1.4–7.0)
Neutrophils: 68 %
Platelets: 144 10*3/uL — ABNORMAL LOW (ref 150–450)
RBC: 4.84 x10E6/uL (ref 3.77–5.28)
RDW: 12.6 % (ref 11.7–15.4)
WBC: 6.2 10*3/uL (ref 3.4–10.8)

## 2021-09-18 LAB — LIPID PANEL
Chol/HDL Ratio: 2.7 ratio (ref 0.0–4.4)
Cholesterol, Total: 226 mg/dL — ABNORMAL HIGH (ref 100–199)
HDL: 83 mg/dL (ref >39)
LDL Chol Calc (NIH): 130 mg/dL — ABNORMAL HIGH (ref 0–99)
Triglycerides: 76 mg/dL (ref 0–149)
VLDL Cholesterol Cal: 13 mg/dL (ref 5–40)

## 2021-09-18 LAB — TSH: TSH: 2.5 u[IU]/mL (ref 0.450–4.500)

## 2021-09-30 ENCOUNTER — Ambulatory Visit: Payer: No Typology Code available for payment source | Admitting: Physician Assistant

## 2021-10-03 ENCOUNTER — Ambulatory Visit: Payer: No Typology Code available for payment source | Admitting: Physician Assistant

## 2021-10-03 NOTE — Progress Notes (Deleted)
      I,Jana Charell Faulk,acting as a Education administrator for [employer], PA-C.,have documented all relevant documentation on the behalf of [name], PA-C,as directed by  [employer], PA-C while in the presence of [employer], PA-C.  Established patient visit   Patient: Krista Thompson   DOB: [birth year]   72 y.o. Female  MRN: [id number] Visit Date: 10/03/2021  Today's healthcare provider: [name], PA-C   No chief complaint on file. CC: lab review / physical forms for insurance  Subjective    Krista Thompson is a 71 yr old female presenting for lab review and insurance forms  Seen for CPE on 09-16-21. Your labwork results all are within normal limits.  Except your lipids: cholesterol and ldl (bad) cholesterol are increased The 10-year ASCVD risk score (Arnett DK, et al., 2019) is: 9.2% You might need to start on cholesterol medication. Your platelets are slightly low.   Medications: Outpatient Medications Prior to Visit  Medication Sig   calcium-vitamin D (OSCAL WITH D) 500-200 MG-UNIT tablet Take 1 tablet by mouth.   cyanocobalamin 1000 MCG tablet Take 1,000 mcg by mouth daily.   Flaxseed Oil OIL by Does not apply route.   Multiple Vitamin (MULTIVITAMIN) tablet Take 1 tablet by mouth daily.   Omega 3 340 MG CPDR Take 1 each by mouth 2 (two) times daily.   vitamin C (ASCORBIC ACID) 500 MG tablet Take 500 mg by mouth daily.   No facility-administered medications prior to visit.    Review of Systems  {Labs  Heme  Chem  Endocrine  Serology  Results Review (optional):23779}   Objective    There were no vitals taken for this visit. {Show previous vital signs (optional):23777}  Physical Exam  ***  No results found for any visits on 10/03/21.  Assessment & Plan     ***  No follow-ups on file.      {provider attestation***:1}   [name], Hershal Coria  [facility] [phone] (phone) [phone] (fax)  [place]

## 2021-10-14 NOTE — Progress Notes (Unsigned)
I,Verna Desrocher Robinson,acting as a Education administrator for [employer], PA-C.,have documented all relevant documentation on the behalf of [name], PA-C,as directed by  [employer], PA-C while in the presence of [employer], PA-C.    Established patient visit   Patient: Krista Thompson   DOB: August 24, 1950   71 y.o. Female  MRN: [id number] Visit Date: 10/15/2021  Today's healthcare provider: [name], PA-C  CC: FU for HLD and lab results  Subjective     Patient coming to discuss labs results from 09-16-21 physical including hyperlipidemia and ASCVD risk of >9%. Pt is reluctant to start on statin. She adheres to healthy dieting and stays very active at work and at home.She would like to discuss natural options for treatment of HLD.  Medications: Outpatient Medications Prior to Visit  Medication Sig   calcium-vitamin D (OSCAL WITH D) 500-200 MG-UNIT tablet Take 1 tablet by mouth.   cyanocobalamin 1000 MCG tablet Take 1,000 mcg by mouth daily.   Flaxseed Oil OIL by Does not apply route.   Multiple Vitamin (MULTIVITAMIN) tablet Take 1 tablet by mouth daily.   Omega 3 340 MG CPDR Take 1 each by mouth 2 (two) times daily.   vitamin C (ASCORBIC ACID) 500 MG tablet Take 500 mg by mouth daily.   No facility-administered medications prior to visit.    Review of Systems  All other systems reviewed and are negative. Except HPI     Objective    BP 132/68 (BP Location: Left Arm, Patient Position: Sitting, Cuff Size: Normal)   Pulse 65   Temp 98.4 F (36.9 C) (Oral)   Resp 16   Wt 114 lb 14.4 oz (52.1 kg)   SpO2 99%   BMI 21.71 kg/m    Physical Exam Vitals reviewed.  Constitutional:      Appearance: Normal appearance. She is well-developed and normal weight. She is not diaphoretic.  HENT:     Head: Normocephalic and atraumatic.     Nose: Nose normal.  Eyes:     General: No scleral icterus.    Extraocular Movements: Extraocular movements intact.     Conjunctiva/sclera: Conjunctivae  normal.     Pupils: Pupils are equal, round, and reactive to light.  Neck:     Thyroid: No thyromegaly.  Cardiovascular:     Rate and Rhythm: Normal rate and regular rhythm.     Pulses: Normal pulses.     Heart sounds: Normal heart sounds. No murmur heard. Pulmonary:     Effort: Pulmonary effort is normal. No respiratory distress.     Breath sounds: Normal breath sounds. No wheezing, rhonchi or rales.  Abdominal:     General: Abdomen is flat. Bowel sounds are normal.     Palpations: Abdomen is soft.  Musculoskeletal:        General: Normal range of motion.     Cervical back: Normal range of motion and neck supple.     Right lower leg: No edema.     Left lower leg: No edema.  Lymphadenopathy:     Cervical: No cervical adenopathy.  Skin:    General: Skin is warm and dry.     Findings: No rash.  Neurological:     General: No focal deficit present.     Mental Status: She is alert and oriented to person, place, and time. Mental status is at baseline.  Psychiatric:        Mood and Affect: Mood normal.        Behavior:  Behavior normal.        Thought Content: Thought content normal.        Judgment: Judgment normal.       No results found for any visits on 10/15/21.  Assessment & Plan     1. Mixed hyperlipidemia Cholesterol 226 vs LDL 130 from 09/17/21 Discussed different options Including supplements vs statin Pt will try red rice yeast extract with coenzyme Q10  2. Thrombocytopenia Platelets was 144 on 09/17/21 Will recheck during the FU visit  Check DG bone density Check if screening mammogram were done  FU as scheduled/in 6 mo The patient was advised to call back or seek an in-person evaluation if the symptoms worsen or if the condition fails to improve as anticipated.  I discussed the assessment and treatment plan with the patient. The patient was provided an opportunity to ask questions and all were answered. The patient agreed with the plan and demonstrated an  understanding of the instructions.  The entirety of the information documented in the History of Present Illness, Review of Systems and Physical Exam were personally obtained by me. Portions of this information were initially documented by the CMA and reviewed by me for thoroughness and accuracy.  Portions of this note were created using dictation software and may contain typographical errors.       Total encounter time more than 20 minutes  Greater than 50% was spent in counseling and coordination of care with the patient     Elberta Leatherwood  [facility] [phone] (phone) [phone] (fax)  [place]

## 2021-10-15 ENCOUNTER — Encounter: Payer: Self-pay | Admitting: Physician Assistant

## 2021-10-15 ENCOUNTER — Ambulatory Visit (INDEPENDENT_AMBULATORY_CARE_PROVIDER_SITE_OTHER): Payer: No Typology Code available for payment source | Admitting: Physician Assistant

## 2021-10-15 VITALS — BP 132/68 | HR 65 | Temp 98.4°F | Resp 16 | Wt 114.9 lb

## 2021-10-15 DIAGNOSIS — E782 Mixed hyperlipidemia: Secondary | ICD-10-CM | POA: Diagnosis not present

## 2021-10-15 DIAGNOSIS — D696 Thrombocytopenia, unspecified: Secondary | ICD-10-CM | POA: Diagnosis not present

## 2022-08-05 ENCOUNTER — Encounter: Payer: Self-pay | Admitting: Physician Assistant

## 2022-08-05 ENCOUNTER — Ambulatory Visit (INDEPENDENT_AMBULATORY_CARE_PROVIDER_SITE_OTHER): Payer: No Typology Code available for payment source | Admitting: Physician Assistant

## 2022-08-05 VITALS — BP 128/58 | HR 58 | Wt 114.8 lb

## 2022-08-05 DIAGNOSIS — H5789 Other specified disorders of eye and adnexa: Secondary | ICD-10-CM | POA: Diagnosis not present

## 2022-08-05 DIAGNOSIS — J3089 Other allergic rhinitis: Secondary | ICD-10-CM

## 2022-08-05 DIAGNOSIS — H9202 Otalgia, left ear: Secondary | ICD-10-CM

## 2022-08-05 NOTE — Progress Notes (Signed)
I,Sha'taria Tyson,acting as a Neurosurgeon for [employer], PA-C.,have documented all relevant documentation on the behalf of Debera Lat, PA-C,as directed by  [employer], PA-C while in the presence of [employer], PA-C.   Established patient visit   Patient: Krista Thompson   DOB: 1950-08-27   72 y.o. Female  MRN: [id number] Visit Date: 08/05/2022  Today's healthcare provider: Debera Lat, PA-C   CC: eye discomfort, ear pain x 1-2 weeks ago  Subjective    Otalgia  There is pain in the left ear. This is a new problem. The current episode started 1 to 4 weeks ago. The problem occurs constantly. The problem has been gradually worsening. There has been no fever. The pain is at a severity of 5/10. She has tried acetaminophen and NSAIDs for the symptoms. The treatment provided mild relief.    Patient reports that her symptoms started out with her left eye being red last week and it went away on its own and shortly after that is when she began to having the pinching sensation in her left ear associated with itchiness. Today, she reports having problem with left eye again.  States that there was a renovation at her work 2 weeks ago. Pt attributed her current symptoms to dust exposure during the renovation. Medications: Outpatient Medications Prior to Visit  Medication Sig   calcium-vitamin D (OSCAL WITH D) 500-200 MG-UNIT tablet Take 1 tablet by mouth.   cyanocobalamin 1000 MCG tablet Take 1,000 mcg by mouth daily.   Flaxseed Oil OIL by Does not apply route.   Multiple Vitamin (MULTIVITAMIN) tablet Take 1 tablet by mouth daily.   Omega 3 340 MG CPDR Take 1 each by mouth 2 (two) times daily.   vitamin C (ASCORBIC ACID) 500 MG tablet Take 500 mg by mouth daily.   No facility-administered medications prior to visit.    Review of Systems  HENT:  Positive for ear pain.   Eyes:  Positive for discharge and itching.       Objective    BP (!) 128/58 (BP Location: Left Arm, Patient  Position: Sitting, Cuff Size: Normal)   Pulse (!) 58   Wt 114 lb 12.8 oz (52.1 kg)   SpO2 100%   BMI 21.69 kg/m    Physical Exam Vitals reviewed.  Constitutional:      General: She is not in acute distress.    Appearance: Normal appearance. She is well-developed. She is not diaphoretic.  HENT:     Head: Normocephalic and atraumatic.     Right Ear: Ear canal and external ear normal. There is no impacted cerumen.     Left Ear: Ear canal and external ear normal.     Nose: Congestion and rhinorrhea present.     Mouth/Throat:     Pharynx: Posterior oropharyngeal erythema (mild) present.     Comments: Postnasal drainage Eyes:     General: No scleral icterus.       Right eye: No discharge.        Left eye: Discharge (watery) present.    Conjunctiva/sclera: Conjunctivae normal.  Neck:     Thyroid: No thyromegaly.  Cardiovascular:     Rate and Rhythm: Normal rate and regular rhythm.     Pulses: Normal pulses.     Heart sounds: Normal heart sounds. No murmur heard. Pulmonary:     Effort: Pulmonary effort is normal. No respiratory distress.     Breath sounds: Normal breath sounds. No wheezing, rhonchi or rales.  Musculoskeletal:     Cervical back: Neck supple.     Right lower leg: No edema.     Left lower leg: No edema.  Lymphadenopathy:     Cervical: No cervical adenopathy.  Skin:    General: Skin is warm and dry.     Findings: No rash.  Neurological:     Mental Status: She is alert and oriented to person, place, and time. Mental status is at baseline.  Psychiatric:        Behavior: Behavior normal.        Thought Content: Thought content normal.        Judgment: Judgment normal.     No results found for any visits on 08/05/22.  Assessment & Plan     1. Non-seasonal allergic rhinitis due to other allergic trigger 2. Left ear pain 3. Redness of eye, left Most likely due to allergy, less likely viral Advised symptomatic treatment: - Avoidance measures discussed. -  Use nasal saline rinses before nose sprays such as with Neilmed Sinus Rinse bottle.  Use distilled water.   - Use Flonase OTC 2 sprays each nostril daily. Aim upward and outward. - Use Zyrtec OTC 10 mg daily.  - Use PATADAY OTC BID for eye symptoms - Drink plenty of water  No follow-ups on file.     The patient was advised to call back or seek an in-person evaluation if the symptoms worsen or if the condition fails to improve as anticipated.  I discussed the assessment and treatment plan with the patient. The patient was provided an opportunity to ask questions and all were answered. The patient agreed with the plan and demonstrated an understanding of the instructions.  I, Debera Lat, PA-C have reviewed all documentation for this visit. The documentation on  08/05/22 for the exam, diagnosis, procedures, and orders are all accurate and complete.  Debera Lat, [facility], M[facility] [phone] (phone) [phone] (fax)  [facility]

## 2022-08-12 ENCOUNTER — Encounter: Payer: Self-pay | Admitting: Physician Assistant

## 2022-08-12 ENCOUNTER — Ambulatory Visit (INDEPENDENT_AMBULATORY_CARE_PROVIDER_SITE_OTHER): Payer: No Typology Code available for payment source | Admitting: Physician Assistant

## 2022-08-12 VITALS — BP 136/64 | HR 55 | Ht 63.0 in | Wt 115.0 lb

## 2022-08-12 DIAGNOSIS — Z1231 Encounter for screening mammogram for malignant neoplasm of breast: Secondary | ICD-10-CM | POA: Diagnosis not present

## 2022-08-12 DIAGNOSIS — D696 Thrombocytopenia, unspecified: Secondary | ICD-10-CM | POA: Diagnosis not present

## 2022-08-12 DIAGNOSIS — E2839 Other primary ovarian failure: Secondary | ICD-10-CM

## 2022-08-12 DIAGNOSIS — E782 Mixed hyperlipidemia: Secondary | ICD-10-CM

## 2022-08-12 NOTE — Progress Notes (Signed)
Complete physical exam   Patient: Krista Thompson   DOB: [birth year]   72 y.o. Female  MRN: [id number] Visit Date: 08/12/2022  Today's healthcare provider: Debera Lat, PA-C   Chief Complaint  Patient presents with   HLD and osteopenia FU, other chronic med conditions FU   Subjective     She reports consuming a  healthy  diet and daily exercise. She generally feels fairly well. She reports sleeping fairly well. She does have additional problems to discuss today.   Pr reports having mild left eye discharge and "inflamed" eye, she attributed them to allergy and associated with mild ear discomfort/pain for a few days. States that all symptoms improving with current treatment.  Past Medical History:  Diagnosis Date   Allergic rhinitis    Allergy    Hyperlipidemia    Osteopenia    Past Surgical History:  Procedure Laterality Date   ABDOMINAL HYSTERECTOMY     BREAST BIOPSY Bilateral 2000   neg   BREAST SURGERY Left    patient cannot remember why/possible a cyst removal   CESAREAN SECTION     COLONOSCOPY WITH PROPOFOL N/A 02/15/2019   Procedure: COLONOSCOPY WITH PROPOFOL;  Surgeon: Wyline Mood, MD;  Location: [facility] ENDOSCOPY;  Service: Gastroenterology;  Laterality: N/A;   Social History   Socioeconomic History   Marital status: Married    Spouse name: Not on file   Number of children: 1   Years of education: Not on file   Highest education level: Not on file  Occupational History   Not on file  Tobacco Use   Smoking status: Never   Smokeless tobacco: Never  Vaping Use   Vaping Use: Never used  Substance and Sexual Activity   Alcohol use: Not Currently   Drug use: Not Currently   Sexual activity: Not Currently  Other Topics Concern   Not on file  Social History Narrative   Not on file   Social Determinants of Health   Financial Resource Strain: Not on file  Food Insecurity: Not on file  Transportation Needs: Not on file  Physical Activity: Not on file   Stress: Not on file  Social Connections: Not on file  Intimate Partner Violence: Not on file   Family Status  Relation Name Status   Mother  Deceased   Father  Deceased   Daughter  Alive   Family History  Problem Relation Age of Onset   Breast cancer Mother 49   Heart attack Father    No Known Allergies  Patient Care Team: Debera Lat, PA-C as PCP - General (Physician Assistant)   Medications: Outpatient Medications Prior to Visit  Medication Sig   calcium-vitamin D (OSCAL WITH D) 500-200 MG-UNIT tablet Take 1 tablet by mouth.   cyanocobalamin 1000 MCG tablet Take 1,000 mcg by mouth daily.   Flaxseed Oil OIL by Does not apply route.   Multiple Vitamin (MULTIVITAMIN) tablet Take 1 tablet by mouth daily.   Omega 3 340 MG CPDR Take 1 each by mouth 2 (two) times daily.   vitamin C (ASCORBIC ACID) 500 MG tablet Take 500 mg by mouth daily.   No facility-administered medications prior to visit.    Review of Systems  All other systems reviewed and are negative.     Objective    BP 136/64   Pulse (!) 55   Ht 5\' 3"  (1.6 m)   Wt 115 lb (52.2 kg)   SpO2 98%   BMI 20.37 kg/m  Physical Exam Vitals reviewed.  Constitutional:      General: She is not in acute distress.    Appearance: Normal appearance. She is well-developed. She is not ill-appearing, toxic-appearing or diaphoretic.  HENT:     Head: Normocephalic and atraumatic.     Right Ear: Tympanic membrane, ear canal and external ear normal.     Left Ear: Tympanic membrane, ear canal and external ear normal.     Nose: Congestion and rhinorrhea present.     Mouth/Throat:     Mouth: Mucous membranes are moist.     Pharynx: Oropharynx is clear. No oropharyngeal exudate or posterior oropharyngeal erythema.  Eyes:     General: No scleral icterus.       Right eye: Discharge present.        Left eye: Discharge present.    Extraocular Movements: Extraocular movements intact.     Conjunctiva/sclera: Conjunctivae  normal.     Pupils: Pupils are equal, round, and reactive to light.  Neck:     Thyroid: No thyromegaly.     Vascular: No carotid bruit.  Cardiovascular:     Rate and Rhythm: Normal rate and regular rhythm.     Pulses: Normal pulses.     Heart sounds: Normal heart sounds. No murmur heard.    No friction rub. No gallop.  Pulmonary:     Effort: Pulmonary effort is normal. No respiratory distress.     Breath sounds: Normal breath sounds. No stridor. No wheezing, rhonchi or rales.  Chest:     Chest wall: No tenderness.  Abdominal:     General: There is no distension.     Palpations: Abdomen is soft. There is no mass.     Tenderness: There is no abdominal tenderness. There is no left CVA tenderness or rebound.     Hernia: No hernia is present.  Musculoskeletal:        General: No swelling, tenderness, deformity or signs of injury.     Cervical back: Normal range of motion and neck supple. No rigidity or tenderness.     Right lower leg: No edema.     Left lower leg: No edema.  Lymphadenopathy:     Cervical: No cervical adenopathy.  Skin:    General: Skin is warm and dry.     Coloration: Skin is not jaundiced or pale.     Findings: No bruising, erythema, lesion or rash.  Neurological:     Mental Status: She is alert and oriented to person, place, and time. Mental status is at baseline.     Cranial Nerves: No cranial nerve deficit.     Sensory: No sensory deficit.     Motor: No weakness.     Coordination: Coordination normal.     Gait: Gait normal.     Deep Tendon Reflexes: Reflexes normal.  Psychiatric:        Mood and Affect: Mood normal.        Behavior: Behavior normal.        Thought Content: Thought content normal.        Judgment: Judgment normal.     Last depression screening scores    08/12/2022    3:47 PM 09/16/2021    9:48 AM 01/20/2019    3:07 PM  PHQ 2/9 Scores  PHQ - 2 Score 0 0 0  PHQ- 9 Score 0 0 0   No results found for any visits on 08/12/22.  Assessment  & Plan  Immunization History  Administered Date(s) Administered   Influenza, High Dose Seasonal PF 01/02/2019   Influenza-Unspecified 02/09/2018   PNEUMOCOCCAL CONJUGATE-20 09/16/2021   Pneumococcal Conjugate-13 02/21/2019   Td 09/16/2021   Tdap 04/03/2008   Zoster Recombinat (Shingrix) 02/21/2019    Health Maintenance  Topic Date Due   COVID-19 Vaccine (1) Never done   DEXA SCAN  Never done   Zoster Vaccines- Shingrix (2 of 2) 04/18/2019   MAMMOGRAM  07/31/2019   INFLUENZA VACCINE  12/03/2022   COLONOSCOPY (Pts 45-6916yrs Insurance coverage will need to be confirmed)  02/14/2029   DTaP/Tdap/Td (3 - Td or Tdap) 09/17/2031   Pneumonia Vaccine 5965+ Years old  Completed   Hepatitis C Screening  Completed   HPV VACCINES  Aged Out    Discussed health benefits of physical activity, and encouraged her to engage in regular exercise appropriate for her age and condition.  1. Mixed hyperlipidemia Chronic and improving Her lidpid are still elevated but less than 11 mo ago Continue to use red rice yeast extract and coQ Continue low fat diet and daily exercise  The 10-year ASCVD risk score (Arnett DK, et al., 2019) is: 9.8% Needs to have an updated labs: - Lipid panel - CBC with Differential/Platelet Will reassess after  receiving lab results  2. Thrombocytopenia Mild in the past Will update her labs: - Lipid panel - CBC with Differential/Platelet Will FU  3. Conjunctivitis Left eye Most likely due to allergy Improved  Continue symptomatic treatment with nasal saline rinses , Flonase OTC, Zyrtec and PATADAY BID for eye symptoms Drink plenty of water.  4. Vaginal dryness and atrophy Due to menopause Pt started on estradiol spray ~ 0.06 mg delivered per spray, was [place] in [place] Advised to continue considering that there are low risk of side effects. Will refer to Gyne if pt agrees  5. Osteopenia Bone density scan was ordered Weight -bearing exercise, Ca  1000-3000mg /day, vit D 600-800 IU per day 6. Mammogram Screening mammogram was ordered   No follow-ups on file.     The patient was advised to call back or seek an in-person evaluation if the symptoms worsen or if the condition fails to improve as anticipated.  I discussed the assessment and treatment plan with the patient. The patient was provided an opportunity to ask questions and all were answered. The patient agreed with the plan and demonstrated an understanding of the instructions.  I, Debera LatJanna Scottlyn Mchaney, PA-C have reviewed all documentation for this visit. The documentation on  08/12/22 for the exam, diagnosis, procedures, and orders are all accurate and complete.  Debera LatJanna Ayden Hardwick, [facility], MMS [facility] [phone] (phone) [phone] (fax)   [facility] The 10-year ASCVD risk score (Arnett DK, et al., 2019) is: 10.2%

## 2022-08-14 LAB — CBC WITH DIFFERENTIAL/PLATELET
Basophils Absolute: 0 10*3/uL (ref 0.0–0.2)
Basos: 0 %
EOS (ABSOLUTE): 0.1 10*3/uL (ref 0.0–0.4)
Eos: 2 %
Hematocrit: 40.8 % (ref 34.0–46.6)
Hemoglobin: 13.6 g/dL (ref 11.1–15.9)
Immature Grans (Abs): 0 10*3/uL (ref 0.0–0.1)
Immature Granulocytes: 0 %
Lymphocytes Absolute: 2.2 10*3/uL (ref 0.7–3.1)
Lymphs: 44 %
MCH: 29.4 pg (ref 26.6–33.0)
MCHC: 33.3 g/dL (ref 31.5–35.7)
MCV: 88 fL (ref 79–97)
Monocytes Absolute: 0.3 10*3/uL (ref 0.1–0.9)
Monocytes: 7 %
Neutrophils Absolute: 2.4 10*3/uL (ref 1.4–7.0)
Neutrophils: 47 %
Platelets: 163 10*3/uL (ref 150–450)
RBC: 4.63 x10E6/uL (ref 3.77–5.28)
RDW: 12.9 % (ref 11.7–15.4)
WBC: 5 10*3/uL (ref 3.4–10.8)

## 2022-08-14 LAB — LIPID PANEL
Chol/HDL Ratio: 2.3 ratio (ref 0.0–4.4)
Cholesterol, Total: 215 mg/dL — ABNORMAL HIGH (ref 100–199)
HDL: 93 mg/dL (ref >39)
LDL Chol Calc (NIH): 108 mg/dL — ABNORMAL HIGH (ref 0–99)
Triglycerides: 81 mg/dL (ref 0–149)
VLDL Cholesterol Cal: 14 mg/dL (ref 5–40)

## 2022-08-14 NOTE — Progress Notes (Signed)
Please, let pt knot that her lipids are still elevated but less than 11 mo ago. Please, continue a healthy diet and daily exercise. The rest labs are WNL.

## 2022-11-09 ENCOUNTER — Inpatient Hospital Stay: Admission: RE | Admit: 2022-11-09 | Payer: Self-pay | Source: Ambulatory Visit

## 2023-01-26 ENCOUNTER — Ambulatory Visit: Payer: No Typology Code available for payment source | Admitting: Physician Assistant

## 2023-01-31 NOTE — Progress Notes (Unsigned)
Established patient visit  Patient: Krista Thompson   DOB: 05-Apr-1951   72 y.o. Female  MRN: [id number] Visit Date: 02/05/2023  Today's healthcare provider: Debera Lat, PA-C   No chief complaint on file.  Subjective    HPI  *** Discussed the use of AI scribe software for clinical note transcription with the patient, who gave verbal consent to proceed.  History of Present Illness               08/12/2022    3:47 PM 09/16/2021    9:48 AM 01/20/2019    3:07 PM  Depression screen PHQ 2/9  Decreased Interest 0 0 0  Down, Depressed, Hopeless 0 0 0  PHQ - 2 Score 0 0 0  Altered sleeping 0 0 0  Tired, decreased energy 0 0 0  Change in appetite 0 0 0  Feeling bad or failure about yourself  0 0 0  Trouble concentrating 0 0 0  Moving slowly or fidgety/restless 0 0 0  Suicidal thoughts 0 0 0  PHQ-9 Score 0 0 0  Difficult doing work/chores Not difficult at all Not difficult at all Not difficult at all       No data to display          Medications: Outpatient Medications Prior to Visit  Medication Sig   calcium-vitamin D (OSCAL WITH D) 500-200 MG-UNIT tablet Take 1 tablet by mouth.   cyanocobalamin 1000 MCG tablet Take 1,000 mcg by mouth daily.   Flaxseed Oil OIL by Does not apply route.   Multiple Vitamin (MULTIVITAMIN) tablet Take 1 tablet by mouth daily.   Omega 3 340 MG CPDR Take 1 each by mouth 2 (two) times daily.   vitamin C (ASCORBIC ACID) 500 MG tablet Take 500 mg by mouth daily.   No facility-administered medications prior to visit.    Review of Systems  All other systems reviewed and are negative.  Except see HPI   {Insert previous labs (optional):23779} {See past labs  Heme  Chem  Endocrine  Serology  Results Review (optional):1}   Objective    There were no vitals taken for this visit. {Insert last BP/Wt (optional):23777}{See vitals history (optional):1}   Physical Exam Vitals reviewed.  Constitutional:      General: She is not in acute  distress.    Appearance: Normal appearance. She is well-developed. She is not diaphoretic.  HENT:     Head: Normocephalic and atraumatic.  Eyes:     General: No scleral icterus.    Conjunctiva/sclera: Conjunctivae normal.  Neck:     Thyroid: No thyromegaly.  Cardiovascular:     Rate and Rhythm: Normal rate and regular rhythm.     Pulses: Normal pulses.     Heart sounds: Normal heart sounds. No murmur heard. Pulmonary:     Effort: Pulmonary effort is normal. No respiratory distress.     Breath sounds: Normal breath sounds. No wheezing, rhonchi or rales.  Musculoskeletal:     Cervical back: Neck supple.     Right lower leg: No edema.     Left lower leg: No edema.  Lymphadenopathy:     Cervical: No cervical adenopathy.  Skin:    General: Skin is warm and dry.     Findings: No rash.  Neurological:     Mental Status: She is alert and oriented to person, place, and time. Mental status is at baseline.  Psychiatric:        Mood and Affect: Mood normal.  Behavior: Behavior normal.      No results found for any visits on 02/05/23.  Assessment & Plan    *** Assessment and Plan              No follow-ups on file.     The patient was advised to call back or seek an in-person evaluation if the symptoms worsen or if the condition fails to improve as anticipated.  I discussed the assessment and treatment plan with the patient. The patient was provided an opportunity to ask questions and all were answered. The patient agreed with the plan and demonstrated an understanding of the instructions.  I, Debera Lat, PA-C have reviewed all documentation for this visit. The documentation on  02/05/23  for the exam, diagnosis, procedures, and orders are all accurate and complete.  Debera Lat, [facility], MMS [facility] [phone] (phone) [phone] (fax)  [facility]

## 2023-02-05 ENCOUNTER — Ambulatory Visit: Payer: No Typology Code available for payment source | Admitting: Physician Assistant

## 2023-02-05 VITALS — BP 147/67 | HR 61 | Temp 97.3°F | Ht 62.0 in | Wt 116.6 lb

## 2023-02-05 DIAGNOSIS — Z23 Encounter for immunization: Secondary | ICD-10-CM

## 2023-02-05 DIAGNOSIS — R030 Elevated blood-pressure reading, without diagnosis of hypertension: Secondary | ICD-10-CM | POA: Diagnosis not present

## 2023-02-05 DIAGNOSIS — E782 Mixed hyperlipidemia: Secondary | ICD-10-CM | POA: Diagnosis not present

## 2023-02-05 DIAGNOSIS — M549 Dorsalgia, unspecified: Secondary | ICD-10-CM | POA: Diagnosis not present

## 2023-02-05 DIAGNOSIS — M8588 Other specified disorders of bone density and structure, other site: Secondary | ICD-10-CM

## 2023-02-05 DIAGNOSIS — D696 Thrombocytopenia, unspecified: Secondary | ICD-10-CM

## 2023-02-05 DIAGNOSIS — N898 Other specified noninflammatory disorders of vagina: Secondary | ICD-10-CM

## 2023-02-07 ENCOUNTER — Encounter: Payer: Self-pay | Admitting: Physician Assistant

## 2023-02-18 NOTE — Progress Notes (Signed)
Established patient visit  Patient: Krista Thompson   DOB: Sep 14, 1950   72 y.o. Female  MRN: [id number] Visit Date: 02/19/2023  Today's healthcare provider: Debera Lat, PA-C   Chief Complaint  Patient presents with   Medical Management of Chronic Issues    Blood pressure follow up. Patient reports no symptoms and has been checking her blood pressure at home, she has reading available today for provider to review.    Subjective     Discussed the use of AI scribe software for clinical note transcription with the patient, who gave verbal consent to proceed.  History of Present Illness   The patient, with a history of blood pressure concerns, presents with recent low blood pressure readings at home. The patient reports feeling a slight discomfort in the chest area, which was described as "something different" but not painful. The patient also mentions a slight discomfort in the throat, which has been ongoing. The patient has been managing these symptoms at home, with no mention of any medication or treatment for these specific symptoms. The patient is due for a mammogram and DEXA scan, and has been advised to continue taking multivitamins daily.           08/12/2022    3:47 PM 09/16/2021    9:48 AM 01/20/2019    3:07 PM  Depression screen PHQ 2/9  Decreased Interest 0 0 0  Down, Depressed, Hopeless 0 0 0  PHQ - 2 Score 0 0 0  Altered sleeping 0 0 0  Tired, decreased energy 0 0 0  Change in appetite 0 0 0  Feeling bad or failure about yourself  0 0 0  Trouble concentrating 0 0 0  Moving slowly or fidgety/restless 0 0 0  Suicidal thoughts 0 0 0  PHQ-9 Score 0 0 0  Difficult doing work/chores Not difficult at all Not difficult at all Not difficult at all       No data to display          Medications: Outpatient Medications Prior to Visit  Medication Sig   calcium-vitamin D (OSCAL WITH D) 500-200 MG-UNIT tablet Take 1 tablet by mouth.   Cholecalciferol (VITAMIN D3 PO) Take by  mouth.   cyanocobalamin 1000 MCG tablet Take 1,000 mcg by mouth daily.   Flaxseed Oil OIL by Does not apply route.   Multiple Vitamin (MULTIVITAMIN) tablet Take 1 tablet by mouth daily.   Omega 3 340 MG CPDR Take 1 each by mouth 2 (two) times daily.   POTASSIUM PO Take by mouth.   vitamin C (ASCORBIC ACID) 500 MG tablet Take 500 mg by mouth daily.   No facility-administered medications prior to visit.    Review of Systems  All other systems reviewed and are negative.  Except see HPI       Objective    BP (!) 127/56 (BP Location: Left Arm, Patient Position: Sitting, Cuff Size: Normal)   Pulse 64   Wt 114 lb 12.8 oz (52.1 kg)   SpO2 100%   BMI 21.00 kg/m     Physical Exam Vitals reviewed.  Constitutional:      General: She is not in acute distress.    Appearance: Normal appearance. She is well-developed. She is not diaphoretic.  HENT:     Head: Normocephalic and atraumatic.  Eyes:     General: No scleral icterus.    Conjunctiva/sclera: Conjunctivae normal.  Neck:     Thyroid: No thyromegaly.  Cardiovascular:  Rate and Rhythm: Normal rate and regular rhythm.     Pulses: Normal pulses.     Heart sounds: Normal heart sounds. No murmur heard. Pulmonary:     Effort: Pulmonary effort is normal. No respiratory distress.     Breath sounds: Normal breath sounds. No wheezing, rhonchi or rales.  Musculoskeletal:     Cervical back: Neck supple.     Right lower leg: No edema.     Left lower leg: No edema.  Lymphadenopathy:     Cervical: No cervical adenopathy.  Skin:    General: Skin is warm and dry.     Findings: No rash.  Neurological:     Mental Status: She is alert and oriented to person, place, and time. Mental status is at baseline.  Psychiatric:        Mood and Affect: Mood normal.        Behavior: Behavior normal.      No results found for any visits on 02/19/23.  Assessment & Plan        Hypotension Irregular rhythm Noted low diastolic blood  pressure readings on home monitoring. No symptoms of dizziness or lightheadedness reported. Patient is advised to continue monitoring blood pressure at home and to bring the device to the next appointment for comparison. -Continue home blood pressure monitoring. -Bring home blood pressure device to next appointment for comparison.  Chest Discomfort New onset of chest discomfort reported during the visit. No history of similar symptoms. EKG suggested for further evaluation. -Perform EKG today. Showed normal sinus rhythm -Follow-up in one month or sooner if symptoms persist or worsen.  Enlarged Lymph Nodes Noted enlarged anterior cervical lymph nodes . Plan to monitor and consider ultrasound if persistent at next visit. Could be due to nasal congestion/seasonal allergies/allergic sinusitis -Monitor lymph nodes. -Consider ultrasound if persistent at next visit.  Hyperlipidemia Chronic.  The 10-year ASCVD risk score (Arnett DK, et al., 2019) is: 8.6% Plan to check lipid panel. Pt declined cholesterol medication -Order lipid panel.  General Health Maintenance -Continue daily multivitamin. -Second dose of shingles vaccine today. -Schedule mammogram and DEXA scan.      Return in about 4 weeks (around 03/19/2023) for chronic disease f/u, BP f/u/lymphadenopathy.     The patient was advised to call back or seek an in-person evaluation if the symptoms worsen or if the condition fails to improve as anticipated.  I discussed the assessment and treatment plan with the patient. The patient was provided an opportunity to ask questions and all were answered. The patient agreed with the plan and demonstrated an understanding of the instructions.  I, Debera Lat, PA-C have reviewed all documentation for this visit. The documentation on  02/19/23 for the exam, diagnosis, procedures, and orders are all accurate and complete.  Debera Lat, [facility], MMS [facility] [phone]  (phone) [phone] (fax)  [facility]

## 2023-02-19 ENCOUNTER — Ambulatory Visit: Payer: No Typology Code available for payment source | Admitting: Physician Assistant

## 2023-02-19 ENCOUNTER — Encounter: Payer: Self-pay | Admitting: Physician Assistant

## 2023-02-19 VITALS — BP 127/56 | HR 64 | Wt 114.8 lb

## 2023-02-19 DIAGNOSIS — I959 Hypotension, unspecified: Secondary | ICD-10-CM | POA: Insufficient documentation

## 2023-02-19 DIAGNOSIS — D696 Thrombocytopenia, unspecified: Secondary | ICD-10-CM

## 2023-02-19 DIAGNOSIS — Z23 Encounter for immunization: Secondary | ICD-10-CM | POA: Diagnosis not present

## 2023-02-19 DIAGNOSIS — Z780 Asymptomatic menopausal state: Secondary | ICD-10-CM

## 2023-02-19 DIAGNOSIS — I9589 Other hypotension: Secondary | ICD-10-CM

## 2023-02-19 DIAGNOSIS — R0789 Other chest pain: Secondary | ICD-10-CM | POA: Diagnosis not present

## 2023-02-19 DIAGNOSIS — R599 Enlarged lymph nodes, unspecified: Secondary | ICD-10-CM

## 2023-02-19 DIAGNOSIS — I499 Cardiac arrhythmia, unspecified: Secondary | ICD-10-CM | POA: Insufficient documentation

## 2023-02-19 DIAGNOSIS — Z13820 Encounter for screening for osteoporosis: Secondary | ICD-10-CM

## 2023-02-19 DIAGNOSIS — M549 Dorsalgia, unspecified: Secondary | ICD-10-CM

## 2023-02-19 DIAGNOSIS — R030 Elevated blood-pressure reading, without diagnosis of hypertension: Secondary | ICD-10-CM

## 2023-02-19 DIAGNOSIS — N898 Other specified noninflammatory disorders of vagina: Secondary | ICD-10-CM | POA: Insufficient documentation

## 2023-02-19 DIAGNOSIS — E782 Mixed hyperlipidemia: Secondary | ICD-10-CM | POA: Diagnosis not present

## 2023-02-19 DIAGNOSIS — Z1231 Encounter for screening mammogram for malignant neoplasm of breast: Secondary | ICD-10-CM

## 2023-02-19 NOTE — Patient Instructions (Signed)
Please contact [phone] to schedule your mammogram and bone density.  Upon results being received our office will contact you. As well as all results can be viewed through your MyChart. Please feel free to contact us if you have any further questions or concerns.

## 2023-02-20 LAB — COMPREHENSIVE METABOLIC PANEL
ALT: 28 [IU]/L (ref 0–32)
AST: 29 [IU]/L (ref 0–40)
Albumin: 4.6 g/dL (ref 3.9–4.9)
Alkaline Phosphatase: 74 [IU]/L (ref 44–121)
BUN/Creatinine Ratio: 25 (ref 12–28)
BUN: 18 mg/dL (ref 8–27)
Bilirubin Total: 0.6 mg/dL (ref 0.0–1.2)
CO2: 22 mmol/L (ref 20–29)
Calcium: 9.8 mg/dL (ref 8.7–10.3)
Chloride: 104 mmol/L (ref 96–106)
Creatinine, Ser: 0.73 mg/dL (ref 0.57–1.00)
Globulin, Total: 2.5 g/dL (ref 1.5–4.5)
Glucose: 88 mg/dL (ref 70–99)
Potassium: 4.1 mmol/L (ref 3.5–5.2)
Sodium: 142 mmol/L (ref 134–144)
Total Protein: 7.1 g/dL (ref 6.0–8.5)
eGFR: 88 mL/min/{1.73_m2} (ref >59)

## 2023-02-20 LAB — CBC WITH DIFFERENTIAL/PLATELET
Basophils Absolute: 0 10*3/uL (ref 0.0–0.2)
Basos: 0 %
EOS (ABSOLUTE): 0 10*3/uL (ref 0.0–0.4)
Eos: 1 %
Hematocrit: 43.9 % (ref 34.0–46.6)
Hemoglobin: 14.7 g/dL (ref 11.1–15.9)
Immature Grans (Abs): 0 10*3/uL (ref 0.0–0.1)
Immature Granulocytes: 0 %
Lymphocytes Absolute: 2 10*3/uL (ref 0.7–3.1)
Lymphs: 40 %
MCH: 29.8 pg (ref 26.6–33.0)
MCHC: 33.5 g/dL (ref 31.5–35.7)
MCV: 89 fL (ref 79–97)
Monocytes Absolute: 0.3 10*3/uL (ref 0.1–0.9)
Monocytes: 7 %
Neutrophils Absolute: 2.7 10*3/uL (ref 1.4–7.0)
Neutrophils: 52 %
Platelets: 171 10*3/uL (ref 150–450)
RBC: 4.94 x10E6/uL (ref 3.77–5.28)
RDW: 13.3 % (ref 11.7–15.4)
WBC: 5.1 10*3/uL (ref 3.4–10.8)

## 2023-02-20 LAB — LIPID PANEL
Chol/HDL Ratio: 2.8 {ratio} (ref 0.0–4.4)
Cholesterol, Total: 242 mg/dL — ABNORMAL HIGH (ref 100–199)
HDL: 88 mg/dL (ref >39)
LDL Chol Calc (NIH): 140 mg/dL — ABNORMAL HIGH (ref 0–99)
Triglycerides: 85 mg/dL (ref 0–149)
VLDL Cholesterol Cal: 14 mg/dL (ref 5–40)

## 2023-03-22 NOTE — Progress Notes (Unsigned)
Established patient visit  Patient: Krista Thompson   DOB: [birth year]   72 y.o. Female  MRN: [id number] Visit Date: 03/23/2023  Today's healthcare provider: Debera Lat, PA-C   No chief complaint on file.  Subjective    HPI  *** Discussed the use of AI scribe software for clinical note transcription with the patient, who gave verbal consent to proceed.  History of Present Illness               08/12/2022    3:47 PM 09/16/2021    9:48 AM 01/20/2019    3:07 PM  Depression screen PHQ 2/9  Decreased Interest 0 0 0  Down, Depressed, Hopeless 0 0 0  PHQ - 2 Score 0 0 0  Altered sleeping 0 0 0  Tired, decreased energy 0 0 0  Change in appetite 0 0 0  Feeling bad or failure about yourself  0 0 0  Trouble concentrating 0 0 0  Moving slowly or fidgety/restless 0 0 0  Suicidal thoughts 0 0 0  PHQ-9 Score 0 0 0  Difficult doing work/chores Not difficult at all Not difficult at all Not difficult at all       No data to display          Medications: Outpatient Medications Prior to Visit  Medication Sig   calcium-vitamin D (OSCAL WITH D) 500-200 MG-UNIT tablet Take 1 tablet by mouth.   Cholecalciferol (VITAMIN D3 PO) Take by mouth.   cyanocobalamin 1000 MCG tablet Take 1,000 mcg by mouth daily.   Flaxseed Oil OIL by Does not apply route.   Multiple Vitamin (MULTIVITAMIN) tablet Take 1 tablet by mouth daily.   Omega 3 340 MG CPDR Take 1 each by mouth 2 (two) times daily.   POTASSIUM PO Take by mouth.   vitamin C (ASCORBIC ACID) 500 MG tablet Take 500 mg by mouth daily.   No facility-administered medications prior to visit.    Review of Systems  All other systems reviewed and are negative.  Except see HPI   {Insert previous labs (optional):23779} {See past labs  Heme  Chem  Endocrine  Serology  Results Review (optional):1}   Objective    There were no vitals taken for this visit. {Insert last BP/Wt (optional):23777}{See vitals history (optional):1}   Physical  Exam Vitals reviewed.  Constitutional:      General: She is not in acute distress.    Appearance: Normal appearance. She is well-developed. She is not diaphoretic.  HENT:     Head: Normocephalic and atraumatic.  Eyes:     General: No scleral icterus.    Conjunctiva/sclera: Conjunctivae normal.  Neck:     Thyroid: No thyromegaly.  Cardiovascular:     Rate and Rhythm: Normal rate and regular rhythm.     Pulses: Normal pulses.     Heart sounds: Normal heart sounds. No murmur heard. Pulmonary:     Effort: Pulmonary effort is normal. No respiratory distress.     Breath sounds: Normal breath sounds. No wheezing, rhonchi or rales.  Musculoskeletal:     Cervical back: Neck supple.     Right lower leg: No edema.     Left lower leg: No edema.  Lymphadenopathy:     Cervical: No cervical adenopathy.  Skin:    General: Skin is warm and dry.     Findings: No rash.  Neurological:     Mental Status: She is alert and oriented to person, place, and time. Mental status is at  baseline.  Psychiatric:        Mood and Affect: Mood normal.        Behavior: Behavior normal.      No results found for any visits on 03/23/23.  Assessment & Plan    *** Assessment and Plan              No follow-ups on file.     The patient was advised to call back or seek an in-person evaluation if the symptoms worsen or if the condition fails to improve as anticipated.  I discussed the assessment and treatment plan with the patient. The patient was provided an opportunity to ask questions and all were answered. The patient agreed with the plan and demonstrated an understanding of the instructions.  I, Debera Lat, PA-C have reviewed all documentation for this visit. The documentation on  03/23/23  for the exam, diagnosis, procedures, and orders are all accurate and complete.  Debera Lat, [facility], MMS [facility] [phone] (phone) [phone] (fax)  [facility]

## 2023-03-23 ENCOUNTER — Ambulatory Visit: Payer: No Typology Code available for payment source | Admitting: Physician Assistant

## 2023-03-23 ENCOUNTER — Encounter: Payer: Self-pay | Admitting: Physician Assistant

## 2023-03-23 VITALS — BP 126/79 | HR 71 | Resp 16 | Ht 62.0 in | Wt 113.6 lb

## 2023-03-23 DIAGNOSIS — R599 Enlarged lymph nodes, unspecified: Secondary | ICD-10-CM | POA: Diagnosis not present

## 2023-03-23 DIAGNOSIS — R0789 Other chest pain: Secondary | ICD-10-CM

## 2023-03-23 DIAGNOSIS — E782 Mixed hyperlipidemia: Secondary | ICD-10-CM | POA: Diagnosis not present

## 2023-03-23 DIAGNOSIS — I9589 Other hypotension: Secondary | ICD-10-CM | POA: Diagnosis not present

## 2023-03-23 DIAGNOSIS — Z91199 Patient's noncompliance with other medical treatment and regimen due to unspecified reason: Secondary | ICD-10-CM

## 2023-06-23 ENCOUNTER — Ambulatory Visit: Payer: No Typology Code available for payment source | Admitting: Physician Assistant

## 2023-07-15 ENCOUNTER — Ambulatory Visit
Admission: RE | Admit: 2023-07-15 | Discharge: 2023-07-15 | Disposition: A | Discharge status: Home / Self Care | Payer: Self-pay | Source: Ambulatory Visit | Attending: Physician Assistant | Admitting: Physician Assistant

## 2023-07-15 DIAGNOSIS — Z1231 Encounter for screening mammogram for malignant neoplasm of breast: Secondary | ICD-10-CM | POA: Diagnosis not present

## 2023-07-18 NOTE — Progress Notes (Unsigned)
 Established patient visit  Patient: Krista Thompson   DOB: 1950-07-30   73 y.o. Female  MRN: [id number] Visit Date: 07/21/2023  Today's healthcare provider: Debera Lat, PA-C   No chief complaint on file.  Subjective       Discussed the use of AI scribe software for clinical note transcription with the patient, who gave verbal consent to proceed.  History of Present Illness               03/23/2023    4:02 PM 08/12/2022    3:47 PM 09/16/2021    9:48 AM  Depression screen PHQ 2/9  Decreased Interest 0 0 0  Down, Depressed, Hopeless 0 0 0  PHQ - 2 Score 0 0 0  Altered sleeping  0 0  Tired, decreased energy  0 0  Change in appetite  0 0  Feeling bad or failure about yourself   0 0  Trouble concentrating  0 0  Moving slowly or fidgety/restless  0 0  Suicidal thoughts  0 0  PHQ-9 Score  0 0  Difficult doing work/chores  Not difficult at all Not difficult at all      03/23/2023    4:02 PM  GAD 7 : Generalized Anxiety Score  Nervous, Anxious, on Edge 0  Control/stop worrying 0  Worry too much - different things 0  Trouble relaxing 0  Restless 0  Easily annoyed or irritable 0  Afraid - awful might happen 0  Total GAD 7 Score 0  Anxiety Difficulty Not difficult at all    Medications: Outpatient Medications Prior to Visit  Medication Sig  . calcium-vitamin D (OSCAL WITH D) 500-200 MG-UNIT tablet Take 1 tablet by mouth.  . Cholecalciferol (VITAMIN D3 PO) Take by mouth.  . cyanocobalamin 1000 MCG tablet Take 1,000 mcg by mouth daily.  . Flaxseed Oil OIL by Does not apply route.  . Multiple Vitamin (MULTIVITAMIN) tablet Take 1 tablet by mouth daily.  . Omega 3 340 MG CPDR Take 1 each by mouth 2 (two) times daily.  Marland Kitchen POTASSIUM PO Take by mouth.  . vitamin C (ASCORBIC ACID) 500 MG tablet Take 500 mg by mouth daily.   No facility-administered medications prior to visit.    Review of Systems  All other systems reviewed and are negative. All negative Except see HPI    {Insert previous labs (optional):23779} {See past labs  Heme  Chem  Endocrine  Serology  Results Review (optional):1}   Objective    There were no vitals taken for this visit. {Insert last BP/Wt (optional):23777}{See vitals history (optional):1}   Physical Exam Vitals reviewed.  Constitutional:      General: She is not in acute distress.    Appearance: Normal appearance. She is well-developed. She is not diaphoretic.  HENT:     Head: Normocephalic and atraumatic.  Eyes:     General: No scleral icterus.    Conjunctiva/sclera: Conjunctivae normal.  Neck:     Thyroid: No thyromegaly.  Cardiovascular:     Rate and Rhythm: Normal rate and regular rhythm.     Pulses: Normal pulses.     Heart sounds: Normal heart sounds. No murmur heard. Pulmonary:     Effort: Pulmonary effort is normal. No respiratory distress.     Breath sounds: Normal breath sounds. No wheezing, rhonchi or rales.  Musculoskeletal:     Cervical back: Neck supple.     Right lower leg: No edema.     Left lower leg: No  edema.  Lymphadenopathy:     Cervical: No cervical adenopathy.  Skin:    General: Skin is warm and dry.     Findings: No rash.  Neurological:     Mental Status: She is alert and oriented to person, place, and time. Mental status is at baseline.  Psychiatric:        Mood and Affect: Mood normal.        Behavior: Behavior normal.     No results found for any visits on 07/21/23.      Assessment and Plan             No orders of the defined types were placed in this encounter.   No follow-ups on file.   The patient was advised to call back or seek an in-person evaluation if the symptoms worsen or if the condition fails to improve as anticipated.  I discussed the assessment and treatment plan with the patient. The patient was provided an opportunity to ask questions and all were answered. The patient agreed with the plan and demonstrated an understanding of the  instructions.  I, Debera Lat, PA-C have reviewed all documentation for this visit. The documentation on 07/21/2023  for the exam, diagnosis, procedures, and orders are all accurate and complete.  Debera Lat, [facility], MMS [facility] [phone] (phone) [phone] (fax)  [facility]

## 2023-07-21 ENCOUNTER — Encounter: Payer: Self-pay | Admitting: Physician Assistant

## 2023-07-21 ENCOUNTER — Ambulatory Visit: Payer: Self-pay | Admitting: Physician Assistant

## 2023-07-21 VITALS — BP 123/57 | HR 57 | Temp 97.5°F | Resp 16 | Wt 112.6 lb

## 2023-07-21 DIAGNOSIS — I9589 Other hypotension: Secondary | ICD-10-CM

## 2023-07-21 DIAGNOSIS — E782 Mixed hyperlipidemia: Secondary | ICD-10-CM

## 2023-07-21 DIAGNOSIS — R599 Enlarged lymph nodes, unspecified: Secondary | ICD-10-CM

## 2023-08-23 ENCOUNTER — Other Ambulatory Visit

## 2023-08-25 ENCOUNTER — Encounter: Payer: Self-pay | Admitting: Physician Assistant

## 2023-08-25 ENCOUNTER — Ambulatory Visit (INDEPENDENT_AMBULATORY_CARE_PROVIDER_SITE_OTHER): Admitting: Physician Assistant

## 2023-08-25 VITALS — BP 126/59 | HR 66 | Resp 16 | Ht 64.0 in | Wt 113.1 lb

## 2023-08-25 DIAGNOSIS — J302 Other seasonal allergic rhinitis: Secondary | ICD-10-CM

## 2023-08-25 DIAGNOSIS — H6123 Impacted cerumen, bilateral: Secondary | ICD-10-CM | POA: Diagnosis not present

## 2023-08-25 DIAGNOSIS — Z0000 Encounter for general adult medical examination without abnormal findings: Secondary | ICD-10-CM

## 2023-08-25 NOTE — Progress Notes (Signed)
 Complete physical exam  Patient: Krista Thompson   DOB: 15-Aug-1950   74 y.o. Female  MRN: [id number] Visit Date: 08/25/2023  Today's healthcare provider: Blane Bunting, PA-C   Chief Complaint  Patient presents with   Annual Exam    CPE No other concerns   Subjective    Krista Thompson is a 73 y.o. female who presents today for a complete physical exam.  She reports consuming a  healthy  diet. The patient has a physically strenuous job, but has no regular exercise apart from work.  She generally feels fairly well. She reports sleeping fairly well. She does not have additional problems to discuss today.   Discussed the use of AI scribe software for clinical note transcription with the patient, who gave verbal consent to proceed.  History of Present Illness The patient, with a history of sinusitis, presents for a routine follow-up. She reports no significant changes in her condition. She denies any problems with sleep, snoring, cold intolerance, heat intolerance, dry skin, hair loss, numbness, hoarseness, or swallowing. 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 She also reports no issues with bowel movements or urination, and denies any chest pain or leg swelling. She does mention feeling tired at times, but attributes this to her work rather than any medical condition.   Printed her labs from 07/2023!  Last depression screening scores    08/25/2023    4:12 PM 07/21/2023    3:48 PM 03/23/2023    4:02 PM  PHQ 2/9 Scores  PHQ - 2 Score 0 0 0  PHQ- 9 Score 0 0    Last fall risk screening    08/12/2022    3:46 PM  Fall Risk   Falls in the past year? 0  Number falls in past yr: 0  Injury with Fall? 0   Last Audit-C alcohol use screening    08/12/2022    3:46 PM  Alcohol Use Disorder Test (AUDIT)  1. How often do you have a drink containing alcohol? 0   A score of 3 or more in women, and 4 or more in men indicates increased  risk for alcohol abuse, EXCEPT if all of the points are from question 1   Past Medical History:  Diagnosis Date   Allergic rhinitis    Allergy    Hyperlipidemia    Osteopenia    Past Surgical History:  Procedure Laterality Date   ABDOMINAL HYSTERECTOMY     BREAST BIOPSY Bilateral 2000   neg   BREAST SURGERY Left    patient cannot remember why/possible a cyst removal   CESAREAN SECTION     COLONOSCOPY WITH PROPOFOL  N/A 02/15/2019   Procedure: COLONOSCOPY WITH PROPOFOL ;  Surgeon: Luke Salaam, MD;  Location: [facility] ENDOSCOPY;  Service: Gastroenterology;  Laterality: N/A;   Social History   Socioeconomic History   Marital status: Married    Spouse name: Not on file   Number of children: 1   Years of education: Not on file   Highest education level: Not on file  Occupational History   Not on file  Tobacco Use   Smoking status: Never   Smokeless tobacco: Never  Vaping Use   Vaping status: Never Used  Substance and Sexual Activity   Alcohol use: Not Currently   Drug use: Not Currently   Sexual activity: Not Currently  Other Topics Concern   Not on file  Social History Narrative  Not on file   Social Drivers of Health   Financial Resource Strain: Not on file  Food Insecurity: Not on file  Transportation Needs: Not on file  Physical Activity: Not on file  Stress: Not on file  Social Connections: Not on file  Intimate Partner Violence: Not on file   Family Status  Relation Name Status   Mother  Deceased   Father  Deceased   Daughter  Alive  No partnership data on file   Family History  Problem Relation Age of Onset   Breast cancer Mother 8   Heart attack Father    No Known Allergies  Patient Care Team: Kimsey Demaree, PA-C as PCP - General (Physician Assistant)   Medications: Outpatient Medications Prior to Visit  Medication Sig   calcium-vitamin D (OSCAL WITH D) 500-200 MG-UNIT tablet Take 1 tablet by mouth.   Cholecalciferol (VITAMIN D3 PO) Take by  mouth.   cyanocobalamin 1000 MCG tablet Take 1,000 mcg by mouth daily.   Flaxseed Oil OIL by Does not apply route.   Multiple Vitamin (MULTIVITAMIN) tablet Take 1 tablet by mouth daily.   Omega 3 340 MG CPDR Take 1 each by mouth 2 (two) times daily.   POTASSIUM PO Take by mouth.   vitamin C (ASCORBIC ACID) 500 MG tablet Take 500 mg by mouth daily.   No facility-administered medications prior to visit.    Review of Systems  All other systems reviewed and are negative.  Except see HPI     Objective    BP (!) 126/59 (BP Location: Left Arm, Patient Position: Sitting, Cuff Size: Normal)   Pulse 66   Resp 16   Ht 5\' 4"  (1.626 m)   Wt 113 lb 1.6 oz (51.3 kg)   SpO2 100%   BMI 19.41 kg/m      Physical Exam Vitals reviewed.  Constitutional:      General: She is not in acute distress.    Appearance: Normal appearance. She is well-developed. She is not ill-appearing, toxic-appearing or diaphoretic.  HENT:     Head: Normocephalic and atraumatic.     Right Ear: Tympanic membrane, ear canal and external ear normal.     Left Ear: Tympanic membrane, ear canal and external ear normal.     Nose: Nose normal. No congestion or rhinorrhea.     Mouth/Throat:     Mouth: Mucous membranes are moist.     Pharynx: Oropharynx is clear. No oropharyngeal exudate.  Eyes:     General: No scleral icterus.       Right eye: No discharge.        Left eye: No discharge.     Conjunctiva/sclera: Conjunctivae normal.     Pupils: Pupils are equal, round, and reactive to light.  Neck:     Thyroid: No thyromegaly.     Vascular: No carotid bruit.  Cardiovascular:     Rate and Rhythm: Normal rate and regular rhythm.     Pulses: Normal pulses.     Heart sounds: Normal heart sounds. No murmur heard.    No friction rub. No gallop.  Pulmonary:     Effort: Pulmonary effort is normal. No respiratory distress.     Breath sounds: Normal breath sounds. No wheezing or rales.  Abdominal:     General: Abdomen  is flat. Bowel sounds are normal. There is no distension.     Palpations: Abdomen is soft. There is no mass.     Tenderness: There is no abdominal tenderness.  There is no right CVA tenderness, left CVA tenderness, guarding or rebound.     Hernia: No hernia is present.  Musculoskeletal:        General: No swelling, tenderness, deformity or signs of injury. Normal range of motion.     Cervical back: Normal range of motion and neck supple. No rigidity or tenderness.     Right lower leg: No edema.     Left lower leg: No edema.  Lymphadenopathy:     Cervical: No cervical adenopathy.  Skin:    General: Skin is warm and dry.     Coloration: Skin is not jaundiced or pale.     Findings: No bruising, erythema, lesion or rash.  Neurological:     Mental Status: She is alert and oriented to person, place, and time. Mental status is at baseline.     Gait: Gait normal.  Psychiatric:        Mood and Affect: Mood normal.        Behavior: Behavior normal.        Thought Content: Thought content normal.        Judgment: Judgment normal.      No results found for any visits on 08/25/23.  Assessment & Plan    Routine Health Maintenance and Physical Exam  Exercise Activities and Dietary recommendations  Goals   Healthy diet and exercise advised     Immunization History  Administered Date(s) Administered   Fluad Trivalent(High Dose 65+) 02/05/2023   Influenza Inj Mdck Quad Pf 02/09/2018   Influenza, High Dose Seasonal PF 01/02/2019   Influenza-Unspecified 02/09/2018   PNEUMOCOCCAL CONJUGATE-20 09/16/2021   Pneumococcal Conjugate-13 02/21/2019   Td 09/16/2021   Tdap 04/03/2008   Zoster Recombinant(Shingrix ) 02/21/2019, 02/19/2023    Health Maintenance  Topic Date Due   DEXA scan (bone density measurement)  Never done   COVID-19 Vaccine (1 - 2024-25 season) Never done   Flu Shot  12/03/2023   Mammogram  07/14/2025   Colon Cancer Screening  02/14/2029   DTaP/Tdap/Td vaccine (3 - Td or  Tdap) 09/17/2031   Pneumonia Vaccine  Completed   Hepatitis C Screening  Completed   Zoster (Shingles) Vaccine  Completed   HPV Vaccine  Aged Out   Meningitis B Vaccine  Aged Out    Discussed health benefits of physical activity, and encouraged her to engage in regular exercise appropriate for her age and condition.   Assessment & Plan Annual physical exam (Primary) Needs an annual eye exam Things to do to keep yourself healthy  - Exercise at least 30-45 minutes a day, 3-4 days a week.  - Eat a low-fat diet with lots of fruits and vegetables, up to 7-9 servings per day.  - Seatbelts can save your life. Wear them always.  - Smoke detectors on every level of your home, check batteries every year.  - Eye Doctor - have an eye exam every 1-2 years  - Safe sex - if you may be exposed to STDs, use a condom.  - Alcohol -  If you drink, do it moderately, less than 2 drinks per day.  - Health Care Power of Attorney. Choose someone to speak for you if you are not able.  - Depression is common in our stressful world.If you're feeling down or losing interest in things you normally enjoy, please come in for a visit.  - Violence - If anyone is threatening or hurting you, please call immediately.  Ceruminosis, bilateral Could be effecting hearing  loss Tried to remove cerumen at home Referral to ENT for management  Sinusitis Ongoing sinusitis with dryness. Discussed antihistamines and nasal spray for management. - Recommend antihistamines. - Advise nasal spray twice daily until symptoms subside. - Continue Zyrtec or Flonase if symptoms persist.  Hypertension Well-controlled.  Hyperlipidemia Management effective.  Wellness Visit Routine visit with no acute concerns. Discussed health maintenance and follow-up care. - Schedule physical exam in one year. - Perform blood work at next visit. - Schedule follow-up for chronic conditions in six months.   Return in about 1 year (around  08/24/2024) for CPE in year and chronic in 6 mo.    The patient was advised to call back or seek an in-person evaluation if the symptoms worsen or if the condition fails to improve as anticipated.  I discussed the assessment and treatment plan with the patient. The patient was provided an opportunity to ask questions and all were answered. The patient agreed with the plan and demonstrated an understanding of the instructions.  I, Byrl Latin, PA-C have reviewed all documentation for this visit. The documentation on 08/25/2023  for the exam, diagnosis, procedures, and orders are all accurate and complete.  Blane Bunting, [facility], MMS [facility] [phone] (phone) [phone] (fax)  [facility]

## 2023-10-04 ENCOUNTER — Ambulatory Visit
Admission: RE | Admit: 2023-10-04 | Discharge: 2023-10-04 | Disposition: A | Discharge status: Home / Self Care | Source: Ambulatory Visit | Attending: Physician Assistant | Admitting: Physician Assistant

## 2023-10-04 DIAGNOSIS — M810 Age-related osteoporosis without current pathological fracture: Secondary | ICD-10-CM | POA: Diagnosis not present

## 2023-10-04 DIAGNOSIS — Z13820 Encounter for screening for osteoporosis: Secondary | ICD-10-CM | POA: Diagnosis not present

## 2023-10-04 DIAGNOSIS — Z780 Asymptomatic menopausal state: Secondary | ICD-10-CM | POA: Insufficient documentation

## 2023-10-04 DIAGNOSIS — E782 Mixed hyperlipidemia: Secondary | ICD-10-CM | POA: Diagnosis not present

## 2023-10-04 DIAGNOSIS — I9589 Other hypotension: Secondary | ICD-10-CM | POA: Diagnosis not present

## 2023-10-05 LAB — COMPREHENSIVE METABOLIC PANEL WITH GFR
ALT: 23 IU/L (ref 0–32)
AST: 27 IU/L (ref 0–40)
Albumin: 4.7 g/dL (ref 3.8–4.8)
Alkaline Phosphatase: 70 IU/L (ref 44–121)
BUN/Creatinine Ratio: 27 (ref 12–28)
BUN: 21 mg/dL (ref 8–27)
Bilirubin Total: 0.4 mg/dL (ref 0.0–1.2)
CO2: 21 mmol/L (ref 20–29)
Calcium: 9.4 mg/dL (ref 8.7–10.3)
Chloride: 104 mmol/L (ref 96–106)
Creatinine, Ser: 0.78 mg/dL (ref 0.57–1.00)
Globulin, Total: 2.1 g/dL (ref 1.5–4.5)
Glucose: 93 mg/dL (ref 70–99)
Potassium: 4.1 mmol/L (ref 3.5–5.2)
Sodium: 141 mmol/L (ref 134–144)
Total Protein: 6.8 g/dL (ref 6.0–8.5)
eGFR: 81 mL/min/{1.73_m2} (ref >59)

## 2023-10-05 LAB — CBC WITH DIFFERENTIAL/PLATELET
Basophils Absolute: 0 10*3/uL (ref 0.0–0.2)
Basos: 1 %
EOS (ABSOLUTE): 0.1 10*3/uL (ref 0.0–0.4)
Eos: 2 %
Hematocrit: 41.8 % (ref 34.0–46.6)
Hemoglobin: 14.1 g/dL (ref 11.1–15.9)
Immature Grans (Abs): 0 10*3/uL (ref 0.0–0.1)
Immature Granulocytes: 0 %
Lymphocytes Absolute: 1.8 10*3/uL (ref 0.7–3.1)
Lymphs: 38 %
MCH: 30.3 pg (ref 26.6–33.0)
MCHC: 33.7 g/dL (ref 31.5–35.7)
MCV: 90 fL (ref 79–97)
Monocytes Absolute: 0.3 10*3/uL (ref 0.1–0.9)
Monocytes: 6 %
Neutrophils Absolute: 2.5 10*3/uL (ref 1.4–7.0)
Neutrophils: 53 %
Platelets: 163 10*3/uL (ref 150–450)
RBC: 4.65 x10E6/uL (ref 3.77–5.28)
RDW: 12.9 % (ref 11.7–15.4)
WBC: 4.7 10*3/uL (ref 3.4–10.8)

## 2023-10-05 LAB — LIPID PANEL
Chol/HDL Ratio: 2.6 ratio (ref 0.0–4.4)
Cholesterol, Total: 239 mg/dL — ABNORMAL HIGH (ref 100–199)
HDL: 93 mg/dL (ref >39)
LDL Chol Calc (NIH): 132 mg/dL — ABNORMAL HIGH (ref 0–99)
Triglycerides: 83 mg/dL (ref 0–149)
VLDL Cholesterol Cal: 14 mg/dL (ref 5–40)

## 2023-10-06 ENCOUNTER — Ambulatory Visit: Payer: Self-pay | Admitting: Physician Assistant

## 2023-10-06 NOTE — Progress Notes (Signed)
 All labs wnl except elevated cholesterol/slightly better than from 7 month ago. . Continue with low cholesterol diet. Please, let her know if she wants to start taking cholesterol med, she could let us  know anytime

## 2023-10-07 ENCOUNTER — Ambulatory Visit: Payer: Self-pay | Admitting: Family Medicine

## 2023-10-07 DIAGNOSIS — M8000XA Age-related osteoporosis with current pathological fracture, unspecified site, initial encounter for fracture: Secondary | ICD-10-CM

## 2023-11-01 DIAGNOSIS — H5203 Hypermetropia, bilateral: Secondary | ICD-10-CM | POA: Diagnosis not present

## 2023-11-01 DIAGNOSIS — H52223 Regular astigmatism, bilateral: Secondary | ICD-10-CM | POA: Diagnosis not present

## 2023-11-01 DIAGNOSIS — H40033 Anatomical narrow angle, bilateral: Secondary | ICD-10-CM | POA: Diagnosis not present

## 2023-11-01 DIAGNOSIS — H524 Presbyopia: Secondary | ICD-10-CM | POA: Diagnosis not present

## 2023-11-01 DIAGNOSIS — H40013 Open angle with borderline findings, low risk, bilateral: Secondary | ICD-10-CM | POA: Diagnosis not present

## 2024-02-24 ENCOUNTER — Encounter: Payer: Self-pay | Admitting: Physician Assistant

## 2024-02-24 ENCOUNTER — Ambulatory Visit (INDEPENDENT_AMBULATORY_CARE_PROVIDER_SITE_OTHER): Admitting: Physician Assistant

## 2024-02-24 VITALS — BP 111/69 | HR 67 | Resp 14 | Ht 64.0 in | Wt 115.0 lb

## 2024-02-24 DIAGNOSIS — E782 Mixed hyperlipidemia: Secondary | ICD-10-CM

## 2024-02-24 DIAGNOSIS — M8000XA Age-related osteoporosis with current pathological fracture, unspecified site, initial encounter for fracture: Secondary | ICD-10-CM

## 2024-02-24 NOTE — Progress Notes (Signed)
 Established patient visit  Patient: Krista Thompson   DOB: 1950/07/01   73 y.o. Female  MRN: [id number] Visit Date: 02/24/2024  Today's healthcare provider: Jolynn Spencer, PA-C   Chief Complaint  Patient presents with   Medical Management of Chronic Issues    Pt reports doing good taking all otc medications   Subjective      Discussed the use of AI scribe software for clinical note transcription with the patient, who gave verbal consent to proceed.  History of Present Illness Krista Thompson is a 73 year old female with osteoporosis who presents for follow-up on her bone health management.  She is scheduled for an annual infusion for osteoporosis on November 5th. Recent lab work was completed following her endocrinology visit. Her blood pressure is stable, and she maintains an active lifestyle. She denies depression, anxiety, vision problems, fatigue, or tiredness.       08/25/2023    4:12 PM 07/21/2023    3:48 PM 03/23/2023    4:02 PM  Depression screen PHQ 2/9  Decreased Interest 0 0 0  Down, Depressed, Hopeless 0 0 0  PHQ - 2 Score 0 0 0  Altered sleeping 0 0   Tired, decreased energy 0 0   Change in appetite 0 0   Feeling bad or failure about yourself  0 0   Trouble concentrating 0 0   Moving slowly or fidgety/restless 0 0   Suicidal thoughts 0 0   PHQ-9 Score 0 0   Difficult doing work/chores Not difficult at all Not difficult at all       08/25/2023    4:12 PM 07/21/2023    3:48 PM 03/23/2023    4:02 PM  GAD 7 : Generalized Anxiety Score  Nervous, Anxious, on Edge 0 0 0  Control/stop worrying 0 0 0  Worry too much - different things 0 0 0  Trouble relaxing 0 0 0  Restless 0 0 0  Easily annoyed or irritable 0 0 0  Afraid - awful might happen 0 0 0  Total GAD 7 Score 0 0 0  Anxiety Difficulty Not difficult at all Not difficult at all Not difficult at all    Medications: Outpatient Medications Prior to Visit  Medication Sig   calcium-vitamin D (OSCAL WITH D)  500-200 MG-UNIT tablet Take 1 tablet by mouth.   Cholecalciferol (VITAMIN D3 PO) Take by mouth.   cyanocobalamin 1000 MCG tablet Take 1,000 mcg by mouth daily.   Flaxseed Oil OIL by Does not apply route.   Multiple Vitamin (MULTIVITAMIN) tablet Take 1 tablet by mouth daily.   Omega 3 340 MG CPDR Take 1 each by mouth 2 (two) times daily.   POTASSIUM PO Take by mouth.   vitamin C (ASCORBIC ACID) 500 MG tablet Take 500 mg by mouth daily.   No facility-administered medications prior to visit.    Review of Systems All negative Except see HPI       Objective    BP 111/69   Pulse 67   Resp 14   Ht 5' 4 (1.626 m)   Wt 115 lb (52.2 kg)   SpO2 100%   BMI 19.74 kg/m     Physical Exam Vitals reviewed.  Constitutional:      General: She is not in acute distress.    Appearance: Normal appearance. She is well-developed. She is not diaphoretic.  HENT:     Head: Normocephalic and atraumatic.  Eyes:     General: No  scleral icterus.    Conjunctiva/sclera: Conjunctivae normal.  Neck:     Thyroid: No thyromegaly.  Cardiovascular:     Rate and Rhythm: Normal rate and regular rhythm.     Pulses: Normal pulses.     Heart sounds: Normal heart sounds. No murmur heard. Pulmonary:     Effort: Pulmonary effort is normal. No respiratory distress.     Breath sounds: Normal breath sounds. No wheezing, rhonchi or rales.  Musculoskeletal:     Cervical back: Neck supple.     Right lower leg: No edema.     Left lower leg: No edema.  Lymphadenopathy:     Cervical: No cervical adenopathy.  Skin:    General: Skin is warm and dry.     Findings: No rash.  Neurological:     Mental Status: She is alert and oriented to person, place, and time. Mental status is at baseline.  Psychiatric:        Mood and Affect: Mood normal.        Behavior: Behavior normal.      No results found for any visits on 02/24/24.      Assessment & Plan Osteoporosis Osteoporosis diagnosis confirmed with high  fracture risk. - Administer annual infusion for osteoporosis on November 5th. - Advise taking Tylenol and drinking water before infusion. - Ensure bone mineral density tests every two years for Medicare eligibility.  Mixed hyperlipidemia (Primary) Chronic and previously unstable Continue lifestyle modifications The 10-year ASCVD risk score (Arnett DK, et al., 2019) is: 7.8% Advised a cholesterol medication but pt declined Could see pt in 6 months and proceed with labs/annual physical Will follow-up  General Health Maintenance Active lifestyle maintained without depression, anxiety, vision problems, fatigue, or swelling. Under a lot of stress as her spouse was recently diagnosed with leukemia. - Encourage continued active lifestyle. Could change her schedule to once a year  The 10-year ASCVD risk score (Arnett DK, et al., 2019) is: 7.8%   No orders of the defined types were placed in this encounter.   No follow-ups on file.   The patient was advised to call back or seek an in-person evaluation if the symptoms worsen or if the condition fails to improve as anticipated.  I discussed the assessment and treatment plan with the patient. The patient was provided an opportunity to ask questions and all were answered. The patient agreed with the plan and demonstrated an understanding of the instructions.  I, Naphtali Zywicki, PA-C have reviewed all documentation for this visit. The documentation on 02/24/2024  for the exam, diagnosis, procedures, and orders are all accurate and complete.  Jolynn Spencer, [facility], MMS [facility] [phone] (phone) [phone] (fax)  [facility]

## 2024-03-31 ENCOUNTER — Telehealth: Payer: Self-pay

## 2024-08-25 ENCOUNTER — Encounter: Admitting: Physician Assistant
